# Patient Record
Sex: Female | Born: 1965 | Race: Black or African American | Hispanic: No
Health system: Southern US, Community
[De-identification: ages and names within clinical notes are randomized; demographics above are authoritative.]

## PROBLEM LIST (undated history)

## (undated) DIAGNOSIS — F329 Major depressive disorder, single episode, unspecified: Secondary | ICD-10-CM

## (undated) DIAGNOSIS — E213 Hyperparathyroidism, unspecified: Secondary | ICD-10-CM

## (undated) DIAGNOSIS — E559 Vitamin D deficiency, unspecified: Secondary | ICD-10-CM

## (undated) DIAGNOSIS — F419 Anxiety disorder, unspecified: Secondary | ICD-10-CM

## (undated) DIAGNOSIS — N186 End stage renal disease: Secondary | ICD-10-CM

## (undated) DIAGNOSIS — G2581 Restless legs syndrome: Secondary | ICD-10-CM

## (undated) DIAGNOSIS — F32A Depression, unspecified: Secondary | ICD-10-CM

## (undated) DIAGNOSIS — E611 Iron deficiency: Secondary | ICD-10-CM

## (undated) DIAGNOSIS — D649 Anemia, unspecified: Secondary | ICD-10-CM

## (undated) DIAGNOSIS — Z8614 Personal history of Methicillin resistant Staphylococcus aureus infection: Secondary | ICD-10-CM

## (undated) DIAGNOSIS — D890 Polyclonal hypergammaglobulinemia: Secondary | ICD-10-CM

## (undated) HISTORY — DX: Anxiety disorder, unspecified: F41.9

## (undated) HISTORY — DX: Depression, unspecified: F32.A

## (undated) HISTORY — DX: Iron deficiency: E61.1

## (undated) HISTORY — DX: Anemia, unspecified: D64.9

## (undated) HISTORY — DX: Personal history of Methicillin resistant Staphylococcus aureus infection: Z86.14

## (undated) HISTORY — DX: Restless legs syndrome: G25.81

## (undated) HISTORY — DX: Vitamin D deficiency, unspecified: E55.9

## (undated) HISTORY — DX: End stage renal disease: N18.6

## (undated) HISTORY — DX: Morbid (severe) obesity due to excess calories: E66.01

## (undated) HISTORY — DX: Polyclonal hypergammaglobulinemia: D89.0

## (undated) HISTORY — DX: Hyperparathyroidism, unspecified: E21.3

## (undated) HISTORY — DX: Major depressive disorder, single episode, unspecified: F32.9

---

## 2004-11-05 ENCOUNTER — Emergency Department: Payer: Self-pay | Admitting: General Practice

## 2010-03-29 ENCOUNTER — Ambulatory Visit: Payer: Self-pay | Admitting: Nephrology

## 2010-04-08 ENCOUNTER — Inpatient Hospital Stay: Payer: Self-pay | Admitting: Internal Medicine

## 2010-04-09 ENCOUNTER — Ambulatory Visit: Payer: Self-pay | Admitting: Oncology

## 2010-04-11 ENCOUNTER — Ambulatory Visit: Payer: Self-pay | Admitting: Oncology

## 2010-04-16 LAB — PATHOLOGY REPORT

## 2010-04-17 ENCOUNTER — Ambulatory Visit: Payer: Self-pay | Admitting: Vascular Surgery

## 2010-04-19 ENCOUNTER — Ambulatory Visit: Payer: Self-pay | Admitting: Vascular Surgery

## 2010-04-22 ENCOUNTER — Ambulatory Visit: Payer: Self-pay | Admitting: Vascular Surgery

## 2010-05-12 ENCOUNTER — Ambulatory Visit: Payer: Self-pay | Admitting: Oncology

## 2010-05-15 ENCOUNTER — Ambulatory Visit: Payer: Self-pay | Admitting: Vascular Surgery

## 2010-05-16 ENCOUNTER — Observation Stay: Payer: Self-pay | Admitting: Nephrology

## 2010-06-18 ENCOUNTER — Ambulatory Visit: Payer: Self-pay | Admitting: Internal Medicine

## 2010-09-11 ENCOUNTER — Ambulatory Visit: Payer: Self-pay | Admitting: Vascular Surgery

## 2011-06-23 ENCOUNTER — Inpatient Hospital Stay: Payer: Self-pay | Admitting: Surgery

## 2011-06-23 LAB — COMPREHENSIVE METABOLIC PANEL
Albumin: 2.4 g/dL — ABNORMAL LOW (ref 3.4–5.0)
Anion Gap: 11 (ref 7–16)
Calcium, Total: 9.5 mg/dL (ref 8.5–10.1)
Chloride: 94 mmol/L — ABNORMAL LOW (ref 98–107)
Co2: 30 mmol/L (ref 21–32)
EGFR (African American): 6 — ABNORMAL LOW
Osmolality: 281 (ref 275–301)
Potassium: 4 mmol/L (ref 3.5–5.1)
SGOT(AST): 8 U/L — ABNORMAL LOW (ref 15–37)
Sodium: 135 mmol/L — ABNORMAL LOW (ref 136–145)

## 2011-06-23 LAB — CBC WITH DIFFERENTIAL/PLATELET
Basophil #: 0 10*3/uL (ref 0.0–0.1)
Basophil %: 0.1 %
Eosinophil #: 0.1 10*3/uL (ref 0.0–0.7)
Eosinophil %: 0.5 %
HCT: 27.2 % — ABNORMAL LOW (ref 35.0–47.0)
HGB: 8.9 g/dL — ABNORMAL LOW (ref 12.0–16.0)
Lymphocyte %: 12.6 %
MCH: 29.8 pg (ref 26.0–34.0)
MCHC: 32.5 g/dL (ref 32.0–36.0)
Monocyte #: 1.4 10*3/uL — ABNORMAL HIGH (ref 0.0–0.7)
Neutrophil #: 14.8 10*3/uL — ABNORMAL HIGH (ref 1.4–6.5)
RBC: 2.97 10*6/uL — ABNORMAL LOW (ref 3.80–5.20)
RDW: 15 % — ABNORMAL HIGH (ref 11.5–14.5)

## 2011-06-24 LAB — BASIC METABOLIC PANEL
Anion Gap: 10 (ref 7–16)
Chloride: 95 mmol/L — ABNORMAL LOW (ref 98–107)
Co2: 31 mmol/L (ref 21–32)
Osmolality: 286 (ref 275–301)
Potassium: 4.1 mmol/L (ref 3.5–5.1)

## 2011-06-25 LAB — PHOSPHORUS: Phosphorus: 7.9 mg/dL — ABNORMAL HIGH (ref 2.5–4.9)

## 2011-06-26 LAB — CBC WITH DIFFERENTIAL/PLATELET
Basophil #: 0 10*3/uL (ref 0.0–0.1)
Eosinophil %: 1.8 %
HCT: 25 % — ABNORMAL LOW (ref 35.0–47.0)
HGB: 8.2 g/dL — ABNORMAL LOW (ref 12.0–16.0)
Lymphocyte #: 2.3 10*3/uL (ref 1.0–3.6)
Lymphocyte %: 20.5 %
MCH: 29.7 pg (ref 26.0–34.0)
Monocyte %: 11.5 %
Platelet: 423 10*3/uL (ref 150–440)
RDW: 13.8 % (ref 11.5–14.5)
WBC: 11.4 10*3/uL — ABNORMAL HIGH (ref 3.6–11.0)

## 2011-06-27 LAB — CBC WITH DIFFERENTIAL/PLATELET
Basophil #: 0 10*3/uL (ref 0.0–0.1)
Basophil %: 0.3 %
Eosinophil #: 0.2 10*3/uL (ref 0.0–0.7)
Eosinophil %: 2 %
HCT: 24.4 % — ABNORMAL LOW (ref 35.0–47.0)
HGB: 7.9 g/dL — ABNORMAL LOW (ref 12.0–16.0)
Lymphocyte #: 2 10*3/uL (ref 1.0–3.6)
Lymphocyte %: 23.4 %
MCH: 29.8 pg (ref 26.0–34.0)
MCV: 92 fL (ref 80–100)
Monocyte #: 0.9 10*3/uL — ABNORMAL HIGH (ref 0.0–0.7)
Monocyte %: 10.8 %
Neutrophil #: 5.5 10*3/uL (ref 1.4–6.5)
RDW: 14.7 % — ABNORMAL HIGH (ref 11.5–14.5)
WBC: 8.7 10*3/uL (ref 3.6–11.0)

## 2011-06-28 LAB — WOUND CULTURE

## 2011-06-29 LAB — BASIC METABOLIC PANEL
Anion Gap: 9 (ref 7–16)
BUN: 37 mg/dL — ABNORMAL HIGH (ref 7–18)
Calcium, Total: 9.6 mg/dL (ref 8.5–10.1)
Creatinine: 9.6 mg/dL — ABNORMAL HIGH (ref 0.60–1.30)
EGFR (Non-African Amer.): 5 — ABNORMAL LOW
Glucose: 84 mg/dL (ref 65–99)
Potassium: 4.2 mmol/L (ref 3.5–5.1)
Sodium: 136 mmol/L (ref 136–145)

## 2011-06-29 LAB — CBC WITH DIFFERENTIAL/PLATELET
Basophil #: 0 10*3/uL (ref 0.0–0.1)
Basophil %: 0.3 %
HCT: 25.6 % — ABNORMAL LOW (ref 35.0–47.0)
Lymphocyte %: 26.8 %
MCHC: 32 g/dL (ref 32.0–36.0)
Monocyte %: 9.4 %
Neutrophil %: 59.5 %
RDW: 14.7 % — ABNORMAL HIGH (ref 11.5–14.5)
WBC: 7.9 10*3/uL (ref 3.6–11.0)

## 2011-06-29 LAB — CULTURE, BLOOD (SINGLE)

## 2011-06-30 LAB — CBC WITH DIFFERENTIAL/PLATELET
Basophil #: 0 x10 3/mm 3
Basophil %: 0.5 %
Eosinophil #: 0.3 x10 3/mm 3
Eosinophil %: 3.7 %
HCT: 22.3 % — ABNORMAL LOW
HGB: 7.2 g/dL — ABNORMAL LOW
Lymphocyte %: 29.1 %
Lymphs Abs: 2.1 x10 3/mm 3
MCH: 29.5 pg
MCHC: 32.3 g/dL
MCV: 91 fL
Monocyte #: 0.7 x10 3/mm 3
Monocyte %: 10.2 %
Neutrophil #: 4 x10 3/mm 3
Neutrophil %: 56.5 %
Platelet: 420 x10 3/mm 3
RBC: 2.44 X10 6/mm 3 — ABNORMAL LOW
RDW: 14.4 %
WBC: 7.2 x10 3/mm 3

## 2011-06-30 LAB — PHOSPHORUS: Phosphorus: 6.7 mg/dL — ABNORMAL HIGH

## 2011-07-01 LAB — CBC WITH DIFFERENTIAL/PLATELET
Basophil #: 0 10*3/uL (ref 0.0–0.1)
Basophil %: 0.5 %
Eosinophil #: 0.2 10*3/uL (ref 0.0–0.7)
HCT: 27.6 % — ABNORMAL LOW (ref 35.0–47.0)
Lymphocyte #: 2.5 10*3/uL (ref 1.0–3.6)
MCHC: 32.9 g/dL (ref 32.0–36.0)
MCV: 91 fL (ref 80–100)
Monocyte #: 0.7 10*3/uL (ref 0.0–0.7)
Neutrophil #: 4.8 10*3/uL (ref 1.4–6.5)
Platelet: 381 10*3/uL (ref 150–440)
RBC: 3.04 10*6/uL — ABNORMAL LOW (ref 3.80–5.20)
RDW: 15 % — ABNORMAL HIGH (ref 11.5–14.5)

## 2011-07-02 LAB — CBC WITH DIFFERENTIAL/PLATELET
Basophil %: 0.5 %
Eosinophil #: 0.3 10*3/uL (ref 0.0–0.7)
Eosinophil %: 2.6 %
HCT: 25.6 % — ABNORMAL LOW (ref 35.0–47.0)
HGB: 8.7 g/dL — ABNORMAL LOW (ref 12.0–16.0)
Lymphocyte #: 3.6 10*3/uL (ref 1.0–3.6)
MCH: 30.7 pg (ref 26.0–34.0)
MCHC: 33.8 g/dL (ref 32.0–36.0)
MCV: 91 fL (ref 80–100)
Monocyte #: 0.8 10*3/uL — ABNORMAL HIGH (ref 0.0–0.7)
Neutrophil #: 5.1 10*3/uL (ref 1.4–6.5)
RBC: 2.82 10*6/uL — ABNORMAL LOW (ref 3.80–5.20)

## 2011-07-04 LAB — PHOSPHORUS: Phosphorus: 5.7 mg/dL — ABNORMAL HIGH (ref 2.5–4.9)

## 2011-07-24 ENCOUNTER — Inpatient Hospital Stay: Payer: Self-pay | Admitting: Specialist

## 2011-07-24 LAB — COMPREHENSIVE METABOLIC PANEL
Albumin: 3.1 g/dL — ABNORMAL LOW (ref 3.4–5.0)
Alkaline Phosphatase: 57 U/L (ref 50–136)
Bilirubin,Total: 0.4 mg/dL (ref 0.2–1.0)
Chloride: 98 mmol/L (ref 98–107)
Co2: 26 mmol/L (ref 21–32)
EGFR (Non-African Amer.): 3 — ABNORMAL LOW
Glucose: 78 mg/dL (ref 65–99)
Osmolality: 285 (ref 275–301)
Sodium: 138 mmol/L (ref 136–145)

## 2011-07-24 LAB — CBC
HCT: 21.1 % — ABNORMAL LOW (ref 35.0–47.0)
MCV: 91 fL (ref 80–100)
Platelet: 200 10*3/uL (ref 150–440)
RBC: 2.32 10*6/uL — ABNORMAL LOW (ref 3.80–5.20)
RDW: 15.3 % — ABNORMAL HIGH (ref 11.5–14.5)
WBC: 5.1 10*3/uL (ref 3.6–11.0)

## 2011-07-24 LAB — TROPONIN I: Troponin-I: 0.02 ng/mL

## 2011-07-25 LAB — COMPREHENSIVE METABOLIC PANEL
Albumin: 2.9 g/dL — ABNORMAL LOW (ref 3.4–5.0)
Alkaline Phosphatase: 48 U/L — ABNORMAL LOW (ref 50–136)
Anion Gap: 13 (ref 7–16)
BUN: 48 mg/dL — ABNORMAL HIGH (ref 7–18)
Bilirubin,Total: 0.4 mg/dL (ref 0.2–1.0)
Calcium, Total: 8.6 mg/dL (ref 8.5–10.1)
Chloride: 98 mmol/L (ref 98–107)
Creatinine: 13.27 mg/dL — ABNORMAL HIGH (ref 0.60–1.30)
Glucose: 86 mg/dL (ref 65–99)
Potassium: 5.2 mmol/L — ABNORMAL HIGH (ref 3.5–5.1)
SGOT(AST): 12 U/L — ABNORMAL LOW (ref 15–37)
Sodium: 139 mmol/L (ref 136–145)
Total Protein: 7.6 g/dL (ref 6.4–8.2)

## 2011-07-25 LAB — CBC WITH DIFFERENTIAL/PLATELET
Basophil #: 0 10*3/uL (ref 0.0–0.1)
Basophil %: 0.4 %
Eosinophil #: 0.2 10*3/uL (ref 0.0–0.7)
HCT: 23.6 % — ABNORMAL LOW (ref 35.0–47.0)
HGB: 7.7 g/dL — ABNORMAL LOW (ref 12.0–16.0)
Lymphocyte %: 43.4 %
MCH: 29.8 pg (ref 26.0–34.0)
MCHC: 32.5 g/dL (ref 32.0–36.0)
Monocyte %: 9.1 %
Neutrophil %: 43.6 %
RBC: 2.58 10*6/uL — ABNORMAL LOW (ref 3.80–5.20)
RDW: 14.9 % — ABNORMAL HIGH (ref 11.5–14.5)
WBC: 4.7 10*3/uL (ref 3.6–11.0)

## 2011-07-25 LAB — PROTIME-INR
INR: 0.9
Prothrombin Time: 12.8 secs (ref 11.5–14.7)

## 2011-07-25 LAB — URINALYSIS, COMPLETE
Blood: NEGATIVE
Glucose,UR: 150 mg/dL (ref 0–75)
Nitrite: NEGATIVE
Protein: 100
Squamous Epithelial: 30
WBC UR: 1 /HPF (ref 0–5)

## 2011-07-25 LAB — MAGNESIUM: Magnesium: 2 mg/dL

## 2011-07-25 LAB — HEMOGLOBIN: HGB: 8.7 g/dL — ABNORMAL LOW (ref 12.0–16.0)

## 2011-07-27 LAB — CBC WITH DIFFERENTIAL/PLATELET
Basophil %: 0.6 %
Eosinophil #: 0.1 10*3/uL (ref 0.0–0.7)
Eosinophil %: 2.3 %
HCT: 26.5 % — ABNORMAL LOW (ref 35.0–47.0)
HGB: 8.8 g/dL — ABNORMAL LOW (ref 12.0–16.0)
MCH: 30 pg (ref 26.0–34.0)
MCHC: 33.1 g/dL (ref 32.0–36.0)
MCV: 91 fL (ref 80–100)
Monocyte #: 0.5 10*3/uL (ref 0.0–0.7)
Monocyte %: 11 %
Neutrophil #: 2.2 10*3/uL (ref 1.4–6.5)
Neutrophil %: 45.1 %
Platelet: 202 10*3/uL (ref 150–440)
RBC: 2.93 10*6/uL — ABNORMAL LOW (ref 3.80–5.20)

## 2011-07-27 LAB — RETICULOCYTES
Absolute Retic Count: 0.06 10*6/uL (ref 0.024–0.084)
Reticulocyte: 2 % — ABNORMAL HIGH (ref 0.5–1.5)

## 2011-07-27 LAB — RENAL FUNCTION PANEL
Anion Gap: 9 (ref 7–16)
BUN: 18 mg/dL (ref 7–18)
Glucose: 87 mg/dL (ref 65–99)
Osmolality: 273 (ref 275–301)
Phosphorus: 4.6 mg/dL (ref 2.5–4.9)
Potassium: 4 mmol/L (ref 3.5–5.1)
Sodium: 136 mmol/L (ref 136–145)

## 2011-08-04 ENCOUNTER — Inpatient Hospital Stay: Payer: Self-pay | Admitting: Internal Medicine

## 2011-08-04 LAB — CBC
HGB: 7.9 g/dL — ABNORMAL LOW (ref 12.0–16.0)
MCH: 29.6 pg (ref 26.0–34.0)
MCHC: 32.9 g/dL (ref 32.0–36.0)
MCV: 90 fL (ref 80–100)
Platelet: 170 10*3/uL (ref 150–440)
WBC: 5.9 10*3/uL (ref 3.6–11.0)

## 2011-08-04 LAB — BASIC METABOLIC PANEL
BUN: 74 mg/dL — ABNORMAL HIGH (ref 7–18)
Chloride: 100 mmol/L (ref 98–107)
Creatinine: 19.57 mg/dL — ABNORMAL HIGH (ref 0.60–1.30)
EGFR (African American): 2 — ABNORMAL LOW
EGFR (Non-African Amer.): 2 — ABNORMAL LOW
Osmolality: 298 (ref 275–301)
Potassium: 6.7 mmol/L (ref 3.5–5.1)

## 2011-08-04 LAB — APTT: Activated PTT: 36.9 secs — ABNORMAL HIGH (ref 23.6–35.9)

## 2011-08-04 LAB — PROTIME-INR: Prothrombin Time: 13.4 secs (ref 11.5–14.7)

## 2011-08-05 LAB — BASIC METABOLIC PANEL
BUN: 74 mg/dL — ABNORMAL HIGH (ref 7–18)
Chloride: 98 mmol/L (ref 98–107)
Co2: 27 mmol/L (ref 21–32)
Creatinine: 19.76 mg/dL — ABNORMAL HIGH (ref 0.60–1.30)
EGFR (African American): 2 — ABNORMAL LOW
EGFR (Non-African Amer.): 2 — ABNORMAL LOW
Glucose: 112 mg/dL — ABNORMAL HIGH (ref 65–99)
Osmolality: 298 (ref 275–301)
Potassium: 6.8 mmol/L (ref 3.5–5.1)
Sodium: 138 mmol/L (ref 136–145)

## 2011-08-05 LAB — PHOSPHORUS: Phosphorus: 7.9 mg/dL — ABNORMAL HIGH (ref 2.5–4.9)

## 2011-08-05 LAB — POTASSIUM: Potassium: 5.3 mmol/L — ABNORMAL HIGH (ref 3.5–5.1)

## 2011-08-06 LAB — IRON AND TIBC
Iron Bind.Cap.(Total): 181 ug/dL — ABNORMAL LOW (ref 250–450)
Iron Saturation: 15 %
Iron: 28 ug/dL — ABNORMAL LOW (ref 50–170)
Unbound Iron-Bind.Cap.: 153 ug/dL

## 2011-08-06 LAB — BASIC METABOLIC PANEL
BUN: 32 mg/dL — ABNORMAL HIGH (ref 7–18)
Chloride: 98 mmol/L (ref 98–107)
Co2: 32 mmol/L (ref 21–32)
Creatinine: 11.37 mg/dL — ABNORMAL HIGH (ref 0.60–1.30)
EGFR (Non-African Amer.): 4 — ABNORMAL LOW
Potassium: 4.3 mmol/L (ref 3.5–5.1)

## 2011-08-06 LAB — CBC WITH DIFFERENTIAL/PLATELET
Basophil #: 0 10*3/uL (ref 0.0–0.1)
Basophil %: 0.4 %
Eosinophil #: 0.3 10*3/uL (ref 0.0–0.7)
Eosinophil %: 4.6 %
HCT: 23 % — ABNORMAL LOW (ref 35.0–47.0)
HGB: 7.5 g/dL — ABNORMAL LOW (ref 12.0–16.0)
Lymphocyte #: 2.5 10*3/uL (ref 1.0–3.6)
MCV: 91 fL (ref 80–100)
Neutrophil #: 2.5 10*3/uL (ref 1.4–6.5)
Platelet: 147 10*3/uL — ABNORMAL LOW (ref 150–440)
RBC: 2.52 10*6/uL — ABNORMAL LOW (ref 3.80–5.20)
RDW: 14.8 % — ABNORMAL HIGH (ref 11.5–14.5)
WBC: 5.9 10*3/uL (ref 3.6–11.0)

## 2011-08-06 LAB — TROPONIN I: Troponin-I: 0.05 ng/mL

## 2011-08-06 LAB — PHOSPHORUS: Phosphorus: 6.9 mg/dL — ABNORMAL HIGH (ref 2.5–4.9)

## 2013-07-19 ENCOUNTER — Inpatient Hospital Stay: Payer: Self-pay | Admitting: Internal Medicine

## 2013-07-19 ENCOUNTER — Encounter: Payer: Self-pay | Admitting: *Deleted

## 2013-07-19 LAB — TROPONIN I
TROPONIN-I: 0.14 ng/mL — AB
TROPONIN-I: 0.17 ng/mL — AB
Troponin-I: 0.12 ng/mL — ABNORMAL HIGH

## 2013-07-19 LAB — CK TOTAL AND CKMB (NOT AT ARMC)
CK, Total: 53 U/L
CK, Total: 53 U/L
CK-MB: 1.9 ng/mL (ref 0.5–3.6)
CK-MB: 1.8 ng/mL (ref 0.5–3.6)

## 2013-07-19 LAB — COMPREHENSIVE METABOLIC PANEL
AST: 24 U/L (ref 15–37)
Albumin: 2.9 g/dL — ABNORMAL LOW (ref 3.4–5.0)
Alkaline Phosphatase: 75 U/L
Anion Gap: 8 (ref 7–16)
BILIRUBIN TOTAL: 0.8 mg/dL (ref 0.2–1.0)
BUN: 34 mg/dL — AB (ref 7–18)
Calcium, Total: 9.3 mg/dL (ref 8.5–10.1)
Chloride: 100 mmol/L (ref 98–107)
Co2: 29 mmol/L (ref 21–32)
Creatinine: 5.64 mg/dL — ABNORMAL HIGH (ref 0.60–1.30)
EGFR (Non-African Amer.): 8 — ABNORMAL LOW
GFR CALC AF AMER: 10 — AB
Glucose: 78 mg/dL (ref 65–99)
Osmolality: 280 (ref 275–301)
Potassium: 4 mmol/L (ref 3.5–5.1)
SGPT (ALT): 14 U/L (ref 12–78)
Sodium: 137 mmol/L (ref 136–145)
Total Protein: 8.5 g/dL — ABNORMAL HIGH (ref 6.4–8.2)

## 2013-07-19 LAB — CBC
HCT: 36 % (ref 35.0–47.0)
HGB: 11.9 g/dL — ABNORMAL LOW (ref 12.0–16.0)
MCH: 29.9 pg (ref 26.0–34.0)
MCHC: 33.2 g/dL (ref 32.0–36.0)
MCV: 90 fL (ref 80–100)
Platelet: 156 10*3/uL (ref 150–440)
RBC: 3.99 10*6/uL (ref 3.80–5.20)
RDW: 16.4 % — ABNORMAL HIGH (ref 11.5–14.5)
WBC: 5.5 10*3/uL (ref 3.6–11.0)

## 2013-07-19 LAB — TSH: Thyroid Stimulating Horm: 0.78 u[IU]/mL

## 2013-07-20 ENCOUNTER — Ambulatory Visit: Payer: Self-pay | Admitting: Cardiology

## 2013-07-20 LAB — LIPID PANEL
Cholesterol: 92 mg/dL (ref 0–200)
HDL: 42 mg/dL (ref 40–60)
Ldl Cholesterol, Calc: 38 mg/dL (ref 0–100)
TRIGLYCERIDES: 59 mg/dL (ref 0–200)
VLDL Cholesterol, Calc: 12 mg/dL (ref 5–40)

## 2013-07-20 LAB — CBC WITH DIFFERENTIAL/PLATELET
BASOS ABS: 0 10*3/uL (ref 0.0–0.1)
BASOS PCT: 0.7 %
Eosinophil #: 0.3 10*3/uL (ref 0.0–0.7)
Eosinophil %: 7.4 %
HCT: 34.5 % — ABNORMAL LOW (ref 35.0–47.0)
HGB: 11.1 g/dL — ABNORMAL LOW (ref 12.0–16.0)
Lymphocyte #: 1.2 10*3/uL (ref 1.0–3.6)
Lymphocyte %: 27.1 %
MCH: 29.4 pg (ref 26.0–34.0)
MCHC: 32.3 g/dL (ref 32.0–36.0)
MCV: 91 fL (ref 80–100)
MONO ABS: 0.4 x10 3/mm (ref 0.2–0.9)
Monocyte %: 10 %
NEUTROS ABS: 2.4 10*3/uL (ref 1.4–6.5)
Neutrophil %: 54.8 %
Platelet: 140 10*3/uL — ABNORMAL LOW (ref 150–440)
RBC: 3.78 10*6/uL — ABNORMAL LOW (ref 3.80–5.20)
RDW: 16.7 % — ABNORMAL HIGH (ref 11.5–14.5)
WBC: 4.3 10*3/uL (ref 3.6–11.0)

## 2013-07-20 LAB — BASIC METABOLIC PANEL
Anion Gap: 5 — ABNORMAL LOW (ref 7–16)
BUN: 49 mg/dL — ABNORMAL HIGH (ref 7–18)
CHLORIDE: 100 mmol/L (ref 98–107)
Calcium, Total: 8.9 mg/dL (ref 8.5–10.1)
Co2: 31 mmol/L (ref 21–32)
Creatinine: 6.98 mg/dL — ABNORMAL HIGH (ref 0.60–1.30)
EGFR (African American): 7 — ABNORMAL LOW
GFR CALC NON AF AMER: 6 — AB
Glucose: 99 mg/dL (ref 65–99)
Osmolality: 285 (ref 275–301)
POTASSIUM: 5.3 mmol/L — AB (ref 3.5–5.1)
Sodium: 136 mmol/L (ref 136–145)

## 2013-07-20 LAB — HEMOGLOBIN A1C: HEMOGLOBIN A1C: 4.6 % (ref 4.2–6.3)

## 2013-07-20 LAB — PHOSPHORUS: Phosphorus: 8.2 mg/dL — ABNORMAL HIGH (ref 2.5–4.9)

## 2013-07-21 LAB — PHOSPHORUS: Phosphorus: 5.9 mg/dL — ABNORMAL HIGH (ref 2.5–4.9)

## 2013-07-21 LAB — POTASSIUM: Potassium: 4.6 mmol/L (ref 3.5–5.1)

## 2013-07-21 LAB — KAPPA/LAMBDA FREE LIGHT CHAINS (ARMC)

## 2013-07-21 LAB — PROTEIN ELECTROPHORESIS(ARMC)

## 2013-08-03 ENCOUNTER — Emergency Department: Payer: Self-pay | Admitting: Emergency Medicine

## 2013-09-15 ENCOUNTER — Inpatient Hospital Stay: Payer: Self-pay | Admitting: Internal Medicine

## 2013-09-15 LAB — COMPREHENSIVE METABOLIC PANEL
ALT: 7 U/L — AB (ref 12–78)
Albumin: 2.1 g/dL — ABNORMAL LOW (ref 3.4–5.0)
Alkaline Phosphatase: 74 U/L
Anion Gap: 9 (ref 7–16)
BUN: 47 mg/dL — ABNORMAL HIGH (ref 7–18)
Bilirubin,Total: 0.6 mg/dL (ref 0.2–1.0)
CALCIUM: 9.4 mg/dL (ref 8.5–10.1)
CO2: 28 mmol/L (ref 21–32)
Chloride: 99 mmol/L (ref 98–107)
Creatinine: 7.85 mg/dL — ABNORMAL HIGH (ref 0.60–1.30)
EGFR (African American): 6 — ABNORMAL LOW
GFR CALC NON AF AMER: 5 — AB
Glucose: 84 mg/dL (ref 65–99)
Osmolality: 283 (ref 275–301)
Potassium: 3.4 mmol/L — ABNORMAL LOW (ref 3.5–5.1)
SGOT(AST): 26 U/L (ref 15–37)
Sodium: 136 mmol/L (ref 136–145)
Total Protein: 7.7 g/dL (ref 6.4–8.2)

## 2013-09-15 LAB — CBC
HCT: 31 % — AB (ref 35.0–47.0)
HGB: 10 g/dL — AB (ref 12.0–16.0)
MCH: 27.2 pg (ref 26.0–34.0)
MCHC: 32.4 g/dL (ref 32.0–36.0)
MCV: 84 fL (ref 80–100)
PLATELETS: 256 10*3/uL (ref 150–440)
RBC: 3.69 10*6/uL — ABNORMAL LOW (ref 3.80–5.20)
RDW: 17.1 % — AB (ref 11.5–14.5)
WBC: 7.9 10*3/uL (ref 3.6–11.0)

## 2013-09-15 LAB — PHOSPHORUS: Phosphorus: 4.2 mg/dL (ref 2.5–4.9)

## 2013-09-15 LAB — TROPONIN I
Troponin-I: 0.7 ng/mL — ABNORMAL HIGH
Troponin-I: 0.57 ng/mL — ABNORMAL HIGH
Troponin-I: 0.54 ng/mL — ABNORMAL HIGH

## 2013-09-15 LAB — CK-MB
CK-MB: 1.4 ng/mL (ref 0.5–3.6)
CK-MB: 1.7 ng/mL (ref 0.5–3.6)
CK-MB: 1.5 ng/mL (ref 0.5–3.6)

## 2013-09-15 LAB — APTT: Activated PTT: 45.1 secs — ABNORMAL HIGH (ref 23.6–35.9)

## 2013-09-15 LAB — LIPASE, BLOOD: Lipase: 100 U/L (ref 73–393)

## 2013-09-16 LAB — CBC WITH DIFFERENTIAL/PLATELET
Bands: 22 %
HCT: 27 % — ABNORMAL LOW (ref 35.0–47.0)
HGB: 8.6 g/dL — AB (ref 12.0–16.0)
Lymphocytes: 26 %
MCH: 27 pg (ref 26.0–34.0)
MCHC: 31.8 g/dL — ABNORMAL LOW (ref 32.0–36.0)
MCV: 85 fL (ref 80–100)
METAMYELOCYTE: 6 %
MYELOCYTE: 1 %
Monocytes: 9 %
PLATELETS: 190 10*3/uL (ref 150–440)
RBC: 3.19 10*6/uL — AB (ref 3.80–5.20)
RDW: 17.2 % — AB (ref 11.5–14.5)
Segmented Neutrophils: 36 %
WBC: 3.2 10*3/uL — ABNORMAL LOW (ref 3.6–11.0)

## 2013-09-16 LAB — BASIC METABOLIC PANEL
Anion Gap: 3 — ABNORMAL LOW (ref 7–16)
BUN: 20 mg/dL — AB (ref 7–18)
CALCIUM: 8.1 mg/dL — AB (ref 8.5–10.1)
CHLORIDE: 99 mmol/L (ref 98–107)
CREATININE: 4.14 mg/dL — AB (ref 0.60–1.30)
Co2: 34 mmol/L — ABNORMAL HIGH (ref 21–32)
GFR CALC AF AMER: 14 — AB
GFR CALC NON AF AMER: 12 — AB
Glucose: 90 mg/dL (ref 65–99)
Osmolality: 274 (ref 275–301)
POTASSIUM: 3.1 mmol/L — AB (ref 3.5–5.1)
Sodium: 136 mmol/L (ref 136–145)

## 2013-09-16 LAB — PHOSPHORUS: PHOSPHORUS: 3.3 mg/dL (ref 2.5–4.9)

## 2013-09-16 LAB — APTT
ACTIVATED PTT: 91 s — AB (ref 23.6–35.9)
Activated PTT: 86.2 secs — ABNORMAL HIGH (ref 23.6–35.9)

## 2013-09-17 LAB — BASIC METABOLIC PANEL
ANION GAP: 7 (ref 7–16)
BUN: 24 mg/dL — ABNORMAL HIGH (ref 7–18)
CREATININE: 3.62 mg/dL — AB (ref 0.60–1.30)
Calcium, Total: 8.4 mg/dL — ABNORMAL LOW (ref 8.5–10.1)
Chloride: 100 mmol/L (ref 98–107)
Co2: 32 mmol/L (ref 21–32)
GFR CALC AF AMER: 16 — AB
GFR CALC NON AF AMER: 14 — AB
Glucose: 94 mg/dL (ref 65–99)
OSMOLALITY: 281 (ref 275–301)
Potassium: 3.3 mmol/L — ABNORMAL LOW (ref 3.5–5.1)
Sodium: 139 mmol/L (ref 136–145)

## 2013-09-17 LAB — CBC WITH DIFFERENTIAL/PLATELET
BANDS NEUTROPHIL: 7 %
HCT: 30.1 % — ABNORMAL LOW (ref 35.0–47.0)
HGB: 9.7 g/dL — ABNORMAL LOW (ref 12.0–16.0)
LYMPHS PCT: 5 %
MCH: 27.1 pg (ref 26.0–34.0)
MCHC: 32.2 g/dL (ref 32.0–36.0)
MCV: 84 fL (ref 80–100)
METAMYELOCYTE: 1 %
MONOS PCT: 4 %
Myelocyte: 1 %
Platelet: 198 10*3/uL (ref 150–440)
RBC: 3.58 10*6/uL — AB (ref 3.80–5.20)
RDW: 17.5 % — AB (ref 11.5–14.5)
SEGMENTED NEUTROPHILS: 82 %
WBC: 3.5 10*3/uL — ABNORMAL LOW (ref 3.6–11.0)

## 2013-09-17 LAB — APTT: Activated PTT: 43 secs — ABNORMAL HIGH (ref 23.6–35.9)

## 2013-09-17 LAB — PHOSPHORUS: PHOSPHORUS: 3.1 mg/dL (ref 2.5–4.9)

## 2013-09-19 LAB — CBC WITH DIFFERENTIAL/PLATELET
BASOS ABS: 0 10*3/uL (ref 0.0–0.1)
Basophil %: 0.3 %
EOS ABS: 0 10*3/uL (ref 0.0–0.7)
EOS PCT: 0.3 %
HCT: 29.1 % — ABNORMAL LOW (ref 35.0–47.0)
HGB: 9.2 g/dL — AB (ref 12.0–16.0)
LYMPHS ABS: 0.3 10*3/uL — AB (ref 1.0–3.6)
Lymphocyte %: 9.3 %
MCH: 26.6 pg (ref 26.0–34.0)
MCHC: 31.5 g/dL — ABNORMAL LOW (ref 32.0–36.0)
MCV: 85 fL (ref 80–100)
MONOS PCT: 7.6 %
Monocyte #: 0.2 x10 3/mm (ref 0.2–0.9)
NEUTROS ABS: 2.7 10*3/uL (ref 1.4–6.5)
NEUTROS PCT: 82.5 %
Platelet: 188 10*3/uL (ref 150–440)
RBC: 3.45 10*6/uL — ABNORMAL LOW (ref 3.80–5.20)
RDW: 17.7 % — ABNORMAL HIGH (ref 11.5–14.5)
WBC: 3.3 10*3/uL — ABNORMAL LOW (ref 3.6–11.0)

## 2013-09-20 LAB — CULTURE, BLOOD (SINGLE)

## 2013-09-20 LAB — PHOSPHORUS: PHOSPHORUS: 2.7 mg/dL (ref 2.5–4.9)

## 2013-09-22 LAB — PHOSPHORUS: Phosphorus: 2.5 mg/dL (ref 2.5–4.9)

## 2013-09-22 LAB — HEMOGLOBIN A1C: HEMOGLOBIN A1C: 5.4 % (ref 4.2–6.3)

## 2013-09-23 LAB — TROPONIN I
Troponin-I: 0.06 ng/mL — ABNORMAL HIGH
Troponin-I: 0.07 ng/mL — ABNORMAL HIGH

## 2013-09-23 LAB — EXPECTORATED SPUTUM ASSESSMENT W GRAM STAIN, RFLX TO RESP C

## 2013-09-23 LAB — PLATELET COUNT: PLATELETS: 154 10*3/uL (ref 150–440)

## 2013-09-24 LAB — CBC WITH DIFFERENTIAL/PLATELET
BASOS ABS: 0 10*3/uL (ref 0.0–0.1)
Basophil %: 0.9 %
Eosinophil #: 0.1 10*3/uL (ref 0.0–0.7)
Eosinophil %: 1.3 %
HCT: 31.1 % — AB (ref 35.0–47.0)
HGB: 9.8 g/dL — AB (ref 12.0–16.0)
Lymphocyte #: 0.8 10*3/uL — ABNORMAL LOW (ref 1.0–3.6)
Lymphocyte %: 15.4 %
MCH: 26.5 pg (ref 26.0–34.0)
MCHC: 31.6 g/dL — ABNORMAL LOW (ref 32.0–36.0)
MCV: 84 fL (ref 80–100)
MONOS PCT: 8.5 %
Monocyte #: 0.4 x10 3/mm (ref 0.2–0.9)
NEUTROS ABS: 3.9 10*3/uL (ref 1.4–6.5)
Neutrophil %: 73.9 %
Platelet: 188 10*3/uL (ref 150–440)
RBC: 3.71 10*6/uL — ABNORMAL LOW (ref 3.80–5.20)
RDW: 18.5 % — AB (ref 11.5–14.5)
WBC: 5.2 10*3/uL (ref 3.6–11.0)

## 2013-09-24 LAB — RENAL FUNCTION PANEL
Albumin: 1.8 g/dL — ABNORMAL LOW (ref 3.4–5.0)
Anion Gap: 6 — ABNORMAL LOW (ref 7–16)
BUN: 19 mg/dL — ABNORMAL HIGH (ref 7–18)
CHLORIDE: 102 mmol/L (ref 98–107)
CO2: 32 mmol/L (ref 21–32)
CREATININE: 5.22 mg/dL — AB (ref 0.60–1.30)
Calcium, Total: 7.5 mg/dL — ABNORMAL LOW (ref 8.5–10.1)
EGFR (Non-African Amer.): 9 — ABNORMAL LOW
GFR CALC AF AMER: 10 — AB
Glucose: 82 mg/dL (ref 65–99)
Osmolality: 281 (ref 275–301)
POTASSIUM: 3.1 mmol/L — AB (ref 3.5–5.1)
Phosphorus: 1.7 mg/dL — ABNORMAL LOW (ref 2.5–4.9)
SODIUM: 140 mmol/L (ref 136–145)

## 2013-09-24 LAB — PHOSPHORUS: Phosphorus: 1.9 mg/dL — ABNORMAL LOW (ref 2.5–4.9)

## 2013-09-24 LAB — GLUCOSE, RANDOM
GLUCOSE: 62 mg/dL — AB (ref 65–99)
Glucose: 51 mg/dL — ABNORMAL LOW (ref 65–99)

## 2013-09-26 ENCOUNTER — Encounter: Payer: Self-pay | Admitting: Internal Medicine

## 2013-10-04 ENCOUNTER — Emergency Department: Payer: Self-pay | Admitting: Emergency Medicine

## 2013-10-04 LAB — COMPREHENSIVE METABOLIC PANEL
ALBUMIN: 2.3 g/dL — AB (ref 3.4–5.0)
ALT: 9 U/L — AB (ref 12–78)
AST: 30 U/L (ref 15–37)
Alkaline Phosphatase: 135 U/L — ABNORMAL HIGH
Anion Gap: 11 (ref 7–16)
BILIRUBIN TOTAL: 0.4 mg/dL (ref 0.2–1.0)
BUN: 37 mg/dL — ABNORMAL HIGH (ref 7–18)
CHLORIDE: 104 mmol/L (ref 98–107)
Calcium, Total: 7.7 mg/dL — ABNORMAL LOW (ref 8.5–10.1)
Co2: 24 mmol/L (ref 21–32)
Creatinine: 5.94 mg/dL — ABNORMAL HIGH (ref 0.60–1.30)
EGFR (African American): 9 — ABNORMAL LOW
EGFR (Non-African Amer.): 8 — ABNORMAL LOW
GLUCOSE: 84 mg/dL (ref 65–99)
Osmolality: 285 (ref 275–301)
Potassium: 3 mmol/L — ABNORMAL LOW (ref 3.5–5.1)
Sodium: 139 mmol/L (ref 136–145)
TOTAL PROTEIN: 7.3 g/dL (ref 6.4–8.2)

## 2013-10-04 LAB — LIPASE, BLOOD: LIPASE: 261 U/L (ref 73–393)

## 2013-10-04 LAB — CBC
HCT: 33 % — ABNORMAL LOW (ref 35.0–47.0)
HGB: 10.6 g/dL — ABNORMAL LOW (ref 12.0–16.0)
MCH: 27.2 pg (ref 26.0–34.0)
MCHC: 32.1 g/dL (ref 32.0–36.0)
MCV: 85 fL (ref 80–100)
Platelet: 199 10*3/uL (ref 150–440)
RBC: 3.91 10*6/uL (ref 3.80–5.20)
RDW: 21.7 % — ABNORMAL HIGH (ref 11.5–14.5)
WBC: 4.5 10*3/uL (ref 3.6–11.0)

## 2013-10-10 ENCOUNTER — Encounter: Payer: Self-pay | Admitting: Internal Medicine

## 2013-10-10 ENCOUNTER — Ambulatory Visit: Payer: Self-pay | Admitting: Internal Medicine

## 2013-10-15 ENCOUNTER — Inpatient Hospital Stay: Payer: Self-pay | Admitting: Internal Medicine

## 2013-10-15 LAB — COMPREHENSIVE METABOLIC PANEL
ALBUMIN: 1.9 g/dL — AB (ref 3.4–5.0)
ALK PHOS: 98 U/L
ANION GAP: 14 (ref 7–16)
BUN: 72 mg/dL — ABNORMAL HIGH (ref 7–18)
Bilirubin,Total: 0.6 mg/dL (ref 0.2–1.0)
CALCIUM: 7.9 mg/dL — AB (ref 8.5–10.1)
CHLORIDE: 101 mmol/L (ref 98–107)
Co2: 22 mmol/L (ref 21–32)
Creatinine: 9.4 mg/dL — ABNORMAL HIGH (ref 0.60–1.30)
EGFR (African American): 5 — ABNORMAL LOW
GFR CALC NON AF AMER: 4 — AB
Glucose: 69 mg/dL (ref 65–99)
OSMOLALITY: 293 (ref 275–301)
POTASSIUM: 3.7 mmol/L (ref 3.5–5.1)
SGOT(AST): 31 U/L (ref 15–37)
SGPT (ALT): 8 U/L — ABNORMAL LOW (ref 12–78)
Sodium: 137 mmol/L (ref 136–145)
Total Protein: 6.8 g/dL (ref 6.4–8.2)

## 2013-10-15 LAB — LIPASE, BLOOD: LIPASE: 122 U/L (ref 73–393)

## 2013-10-15 LAB — CBC
HCT: 33.1 % — AB (ref 35.0–47.0)
HGB: 10.5 g/dL — AB (ref 12.0–16.0)
MCH: 26.6 pg (ref 26.0–34.0)
MCHC: 31.8 g/dL — AB (ref 32.0–36.0)
MCV: 84 fL (ref 80–100)
Platelet: 131 10*3/uL — ABNORMAL LOW (ref 150–440)
RBC: 3.95 10*6/uL (ref 3.80–5.20)
RDW: 20.9 % — ABNORMAL HIGH (ref 11.5–14.5)
WBC: 8.5 10*3/uL (ref 3.6–11.0)

## 2013-10-15 LAB — PHOSPHORUS: Phosphorus: 4.8 mg/dL (ref 2.5–4.9)

## 2013-10-15 LAB — MAGNESIUM: MAGNESIUM: 1.4 mg/dL — AB

## 2013-10-16 LAB — CBC WITH DIFFERENTIAL/PLATELET
Basophil #: 0 10*3/uL (ref 0.0–0.1)
Basophil %: 0.2 %
Eosinophil #: 0 10*3/uL (ref 0.0–0.7)
Eosinophil %: 0.3 %
HCT: 30.1 % — ABNORMAL LOW (ref 35.0–47.0)
HGB: 9.5 g/dL — ABNORMAL LOW (ref 12.0–16.0)
LYMPHS PCT: 5 %
Lymphocyte #: 0.3 10*3/uL — ABNORMAL LOW (ref 1.0–3.6)
MCH: 26.8 pg (ref 26.0–34.0)
MCHC: 31.6 g/dL — AB (ref 32.0–36.0)
MCV: 85 fL (ref 80–100)
Monocyte #: 0.5 x10 3/mm (ref 0.2–0.9)
Monocyte %: 7.3 %
NEUTROS PCT: 87.2 %
Neutrophil #: 6.1 10*3/uL (ref 1.4–6.5)
Platelet: 125 10*3/uL — ABNORMAL LOW (ref 150–440)
RBC: 3.54 10*6/uL — ABNORMAL LOW (ref 3.80–5.20)
RDW: 20.5 % — ABNORMAL HIGH (ref 11.5–14.5)
WBC: 7 10*3/uL (ref 3.6–11.0)

## 2013-10-16 LAB — BASIC METABOLIC PANEL
ANION GAP: 6 — AB (ref 7–16)
BUN: 32 mg/dL — ABNORMAL HIGH (ref 7–18)
CALCIUM: 7.5 mg/dL — AB (ref 8.5–10.1)
Chloride: 102 mmol/L (ref 98–107)
Co2: 31 mmol/L (ref 21–32)
Creatinine: 5.45 mg/dL — ABNORMAL HIGH (ref 0.60–1.30)
EGFR (African American): 10 — ABNORMAL LOW
EGFR (Non-African Amer.): 9 — ABNORMAL LOW
GLUCOSE: 56 mg/dL — AB (ref 65–99)
Osmolality: 282 (ref 275–301)
POTASSIUM: 3.4 mmol/L — AB (ref 3.5–5.1)
Sodium: 139 mmol/L (ref 136–145)

## 2013-10-17 LAB — CBC WITH DIFFERENTIAL/PLATELET
Basophil #: 0 10*3/uL (ref 0.0–0.1)
Basophil %: 0.6 %
Eosinophil #: 0 10*3/uL (ref 0.0–0.7)
Eosinophil %: 0.5 %
HCT: 29.9 % — ABNORMAL LOW (ref 35.0–47.0)
HGB: 9.2 g/dL — ABNORMAL LOW (ref 12.0–16.0)
LYMPHS ABS: 0.8 10*3/uL — AB (ref 1.0–3.6)
Lymphocyte %: 8.9 %
MCH: 26.4 pg (ref 26.0–34.0)
MCHC: 30.7 g/dL — ABNORMAL LOW (ref 32.0–36.0)
MCV: 86 fL (ref 80–100)
Monocyte #: 0.5 x10 3/mm (ref 0.2–0.9)
Monocyte %: 5.6 %
NEUTROS PCT: 84.4 %
Neutrophil #: 7.2 10*3/uL — ABNORMAL HIGH (ref 1.4–6.5)
PLATELETS: 126 10*3/uL — AB (ref 150–440)
RBC: 3.47 10*6/uL — ABNORMAL LOW (ref 3.80–5.20)
RDW: 20.2 % — ABNORMAL HIGH (ref 11.5–14.5)
WBC: 8.5 10*3/uL (ref 3.6–11.0)

## 2013-10-17 LAB — VANCOMYCIN, TROUGH: Vancomycin, Trough: 12 ug/mL (ref 10–20)

## 2013-10-18 LAB — CBC WITH DIFFERENTIAL/PLATELET
Basophil #: 0 10*3/uL (ref 0.0–0.1)
Basophil %: 0.2 %
EOS ABS: 0 10*3/uL (ref 0.0–0.7)
Eosinophil %: 0.1 %
HCT: 28.6 % — AB (ref 35.0–47.0)
HGB: 8.9 g/dL — ABNORMAL LOW (ref 12.0–16.0)
Lymphocyte #: 0.3 10*3/uL — ABNORMAL LOW (ref 1.0–3.6)
Lymphocyte %: 3.6 %
MCH: 26.2 pg (ref 26.0–34.0)
MCHC: 31 g/dL — AB (ref 32.0–36.0)
MCV: 85 fL (ref 80–100)
MONO ABS: 0.6 x10 3/mm (ref 0.2–0.9)
Monocyte %: 6.2 %
NEUTROS ABS: 8.1 10*3/uL — AB (ref 1.4–6.5)
Neutrophil %: 89.9 %
Platelet: 121 10*3/uL — ABNORMAL LOW (ref 150–440)
RBC: 3.38 10*6/uL — AB (ref 3.80–5.20)
RDW: 20.4 % — AB (ref 11.5–14.5)
WBC: 9 10*3/uL (ref 3.6–11.0)

## 2013-10-18 LAB — COMPREHENSIVE METABOLIC PANEL
ALK PHOS: 75 U/L
ANION GAP: 12 (ref 7–16)
Albumin: 1.3 g/dL — ABNORMAL LOW (ref 3.4–5.0)
BUN: 57 mg/dL — ABNORMAL HIGH (ref 7–18)
Bilirubin,Total: 0.5 mg/dL (ref 0.2–1.0)
CHLORIDE: 100 mmol/L (ref 98–107)
CO2: 24 mmol/L (ref 21–32)
CREATININE: 7.76 mg/dL — AB (ref 0.60–1.30)
Calcium, Total: 6.5 mg/dL — CL (ref 8.5–10.1)
EGFR (African American): 6 — ABNORMAL LOW
EGFR (Non-African Amer.): 6 — ABNORMAL LOW
Glucose: 70 mg/dL (ref 65–99)
Osmolality: 286 (ref 275–301)
Potassium: 3.7 mmol/L (ref 3.5–5.1)
SGOT(AST): 48 U/L — ABNORMAL HIGH (ref 15–37)
SGPT (ALT): 10 U/L — ABNORMAL LOW (ref 12–78)
Sodium: 136 mmol/L (ref 136–145)
TOTAL PROTEIN: 5.4 g/dL — AB (ref 6.4–8.2)

## 2013-10-18 LAB — CLOSTRIDIUM DIFFICILE(ARMC)

## 2013-10-19 LAB — WBCS, STOOL

## 2013-10-19 LAB — STOOL CULTURE

## 2013-10-20 LAB — CBC WITH DIFFERENTIAL/PLATELET
BASOS ABS: 0 10*3/uL (ref 0.0–0.1)
Basophil %: 0 %
EOS ABS: 0 10*3/uL (ref 0.0–0.7)
Eosinophil %: 0.1 %
HCT: 31.4 % — ABNORMAL LOW (ref 35.0–47.0)
HGB: 9.8 g/dL — AB (ref 12.0–16.0)
LYMPHS ABS: 0.2 10*3/uL — AB (ref 1.0–3.6)
Lymphocyte %: 4 %
MCH: 26.4 pg (ref 26.0–34.0)
MCHC: 31.3 g/dL — AB (ref 32.0–36.0)
MCV: 84 fL (ref 80–100)
Monocyte #: 0.4 x10 3/mm (ref 0.2–0.9)
Monocyte %: 6.9 %
NEUTROS ABS: 4.8 10*3/uL (ref 1.4–6.5)
Neutrophil %: 89 %
PLATELETS: 149 10*3/uL — AB (ref 150–440)
RBC: 3.72 10*6/uL — AB (ref 3.80–5.20)
RDW: 20.2 % — ABNORMAL HIGH (ref 11.5–14.5)
WBC: 5.4 10*3/uL (ref 3.6–11.0)

## 2013-10-20 LAB — COMPREHENSIVE METABOLIC PANEL
Albumin: 1.5 g/dL — ABNORMAL LOW (ref 3.4–5.0)
Alkaline Phosphatase: 155 U/L — ABNORMAL HIGH
Anion Gap: 8 (ref 7–16)
BILIRUBIN TOTAL: 0.4 mg/dL (ref 0.2–1.0)
BUN: 37 mg/dL — ABNORMAL HIGH (ref 7–18)
Calcium, Total: 6.5 mg/dL — CL (ref 8.5–10.1)
Chloride: 97 mmol/L — ABNORMAL LOW (ref 98–107)
Co2: 31 mmol/L (ref 21–32)
Creatinine: 5.23 mg/dL — ABNORMAL HIGH (ref 0.60–1.30)
EGFR (African American): 10 — ABNORMAL LOW
EGFR (Non-African Amer.): 9 — ABNORMAL LOW
Glucose: 113 mg/dL — ABNORMAL HIGH (ref 65–99)
Osmolality: 281 (ref 275–301)
POTASSIUM: 3.5 mmol/L (ref 3.5–5.1)
SGOT(AST): 37 U/L (ref 15–37)
SGPT (ALT): 10 U/L — ABNORMAL LOW (ref 12–78)
Sodium: 136 mmol/L (ref 136–145)
TOTAL PROTEIN: 5.7 g/dL — AB (ref 6.4–8.2)

## 2013-10-20 LAB — CULTURE, BLOOD (SINGLE)

## 2013-10-20 LAB — PHOSPHORUS: PHOSPHORUS: 2.4 mg/dL — AB (ref 2.5–4.9)

## 2013-10-20 LAB — VANCOMYCIN, TROUGH: Vancomycin, Trough: 9 ug/mL — ABNORMAL LOW (ref 10–20)

## 2013-10-21 LAB — CBC WITH DIFFERENTIAL/PLATELET
Basophil #: 0 10*3/uL (ref 0.0–0.1)
Basophil %: 0.2 %
EOS PCT: 0.4 %
Eosinophil #: 0 10*3/uL (ref 0.0–0.7)
HCT: 30.4 % — ABNORMAL LOW (ref 35.0–47.0)
HGB: 9.5 g/dL — ABNORMAL LOW (ref 12.0–16.0)
Lymphocyte #: 0.2 10*3/uL — ABNORMAL LOW (ref 1.0–3.6)
Lymphocyte %: 5.7 %
MCH: 26.3 pg (ref 26.0–34.0)
MCHC: 31.2 g/dL — AB (ref 32.0–36.0)
MCV: 85 fL (ref 80–100)
MONOS PCT: 4.7 %
Monocyte #: 0.2 x10 3/mm (ref 0.2–0.9)
NEUTROS ABS: 3.6 10*3/uL (ref 1.4–6.5)
NEUTROS PCT: 89 %
PLATELETS: 128 10*3/uL — AB (ref 150–440)
RBC: 3.6 10*6/uL — AB (ref 3.80–5.20)
RDW: 20.1 % — ABNORMAL HIGH (ref 11.5–14.5)
WBC: 4.1 10*3/uL (ref 3.6–11.0)

## 2013-10-21 LAB — BASIC METABOLIC PANEL
Anion Gap: 4 — ABNORMAL LOW (ref 7–16)
BUN: 21 mg/dL — ABNORMAL HIGH (ref 7–18)
CHLORIDE: 100 mmol/L (ref 98–107)
CREATININE: 3.76 mg/dL — AB (ref 0.60–1.30)
Calcium, Total: 6.8 mg/dL — CL (ref 8.5–10.1)
Co2: 34 mmol/L — ABNORMAL HIGH (ref 21–32)
EGFR (Non-African Amer.): 13 — ABNORMAL LOW
GFR CALC AF AMER: 15 — AB
Glucose: 94 mg/dL (ref 65–99)
Osmolality: 278 (ref 275–301)
Potassium: 3 mmol/L — ABNORMAL LOW (ref 3.5–5.1)
Sodium: 138 mmol/L (ref 136–145)

## 2013-10-21 LAB — CLOSTRIDIUM DIFFICILE(ARMC)

## 2013-10-22 LAB — CBC WITH DIFFERENTIAL/PLATELET
Basophil #: 0 10*3/uL (ref 0.0–0.1)
Basophil %: 0.3 %
EOS PCT: 0.6 %
Eosinophil #: 0 10*3/uL (ref 0.0–0.7)
HCT: 30.4 % — ABNORMAL LOW (ref 35.0–47.0)
HGB: 9.2 g/dL — ABNORMAL LOW (ref 12.0–16.0)
Lymphocyte #: 0.4 10*3/uL — ABNORMAL LOW (ref 1.0–3.6)
Lymphocyte %: 7.9 %
MCH: 26.1 pg (ref 26.0–34.0)
MCHC: 30.4 g/dL — ABNORMAL LOW (ref 32.0–36.0)
MCV: 86 fL (ref 80–100)
Monocyte #: 0.2 x10 3/mm (ref 0.2–0.9)
Monocyte %: 5.4 %
NEUTROS ABS: 3.9 10*3/uL (ref 1.4–6.5)
Neutrophil %: 85.8 %
Platelet: 108 10*3/uL — ABNORMAL LOW (ref 150–440)
RBC: 3.54 10*6/uL — ABNORMAL LOW (ref 3.80–5.20)
RDW: 19.5 % — AB (ref 11.5–14.5)
WBC: 4.4 10*3/uL (ref 3.6–11.0)

## 2013-10-22 LAB — BASIC METABOLIC PANEL
ANION GAP: 7 (ref 7–16)
BUN: 26 mg/dL — ABNORMAL HIGH (ref 7–18)
CALCIUM: 7 mg/dL — AB (ref 8.5–10.1)
CHLORIDE: 103 mmol/L (ref 98–107)
Co2: 30 mmol/L (ref 21–32)
Creatinine: 4.9 mg/dL — ABNORMAL HIGH (ref 0.60–1.30)
EGFR (African American): 11 — ABNORMAL LOW
EGFR (Non-African Amer.): 10 — ABNORMAL LOW
GLUCOSE: 59 mg/dL — AB (ref 65–99)
Osmolality: 282 (ref 275–301)
Potassium: 3 mmol/L — ABNORMAL LOW (ref 3.5–5.1)
Sodium: 140 mmol/L (ref 136–145)

## 2013-10-22 LAB — PHOSPHORUS: PHOSPHORUS: 1 mg/dL — AB (ref 2.5–4.9)

## 2013-10-23 LAB — PHOSPHORUS: Phosphorus: 1.8 mg/dL — ABNORMAL LOW (ref 2.5–4.9)

## 2013-10-23 LAB — EXPECTORATED SPUTUM ASSESSMENT W GRAM STAIN, RFLX TO RESP C

## 2013-10-24 LAB — BASIC METABOLIC PANEL
Anion Gap: 9 (ref 7–16)
BUN: 27 mg/dL — AB (ref 7–18)
CALCIUM: 6.4 mg/dL — AB (ref 8.5–10.1)
CHLORIDE: 101 mmol/L (ref 98–107)
CO2: 29 mmol/L (ref 21–32)
CREATININE: 4.78 mg/dL — AB (ref 0.60–1.30)
EGFR (African American): 12 — ABNORMAL LOW
EGFR (Non-African Amer.): 10 — ABNORMAL LOW
GLUCOSE: 51 mg/dL — AB (ref 65–99)
OSMOLALITY: 280 (ref 275–301)
Potassium: 3.2 mmol/L — ABNORMAL LOW (ref 3.5–5.1)
Sodium: 139 mmol/L (ref 136–145)

## 2013-10-24 LAB — CBC WITH DIFFERENTIAL/PLATELET
Basophil #: 0 10*3/uL (ref 0.0–0.1)
Basophil %: 0.7 %
EOS ABS: 0 10*3/uL (ref 0.0–0.7)
Eosinophil %: 0.3 %
HCT: 27 % — AB (ref 35.0–47.0)
HGB: 8.5 g/dL — AB (ref 12.0–16.0)
LYMPHS PCT: 6.4 %
Lymphocyte #: 0.4 10*3/uL — ABNORMAL LOW (ref 1.0–3.6)
MCH: 26.8 pg (ref 26.0–34.0)
MCHC: 31.5 g/dL — ABNORMAL LOW (ref 32.0–36.0)
MCV: 85 fL (ref 80–100)
MONOS PCT: 4.3 %
Monocyte #: 0.3 x10 3/mm (ref 0.2–0.9)
NEUTROS PCT: 88.3 %
Neutrophil #: 6 10*3/uL (ref 1.4–6.5)
Platelet: 104 10*3/uL — ABNORMAL LOW (ref 150–440)
RBC: 3.17 10*6/uL — ABNORMAL LOW (ref 3.80–5.20)
RDW: 19.5 % — AB (ref 11.5–14.5)
WBC: 6.8 10*3/uL (ref 3.6–11.0)

## 2013-10-24 LAB — RAPID HIV-1/2 QL/CONFIRM: HIV-1/2,Rapid Ql: POSITIVE

## 2013-10-24 LAB — PHOSPHORUS: PHOSPHORUS: 2.9 mg/dL (ref 2.5–4.9)

## 2013-10-25 LAB — COMPREHENSIVE METABOLIC PANEL
ALBUMIN: 1.6 g/dL — AB (ref 3.4–5.0)
AST: 21 U/L (ref 15–37)
Alkaline Phosphatase: 130 U/L — ABNORMAL HIGH
Anion Gap: 9 (ref 7–16)
BUN: 37 mg/dL — AB (ref 7–18)
Bilirubin,Total: 1 mg/dL (ref 0.2–1.0)
CHLORIDE: 100 mmol/L (ref 98–107)
Calcium, Total: 6.2 mg/dL — CL (ref 8.5–10.1)
Co2: 29 mmol/L (ref 21–32)
Creatinine: 5.79 mg/dL — ABNORMAL HIGH (ref 0.60–1.30)
GFR CALC AF AMER: 9 — AB
GFR CALC NON AF AMER: 8 — AB
GLUCOSE: 44 mg/dL — AB (ref 65–99)
Osmolality: 281 (ref 275–301)
Potassium: 3.4 mmol/L — ABNORMAL LOW (ref 3.5–5.1)
SGPT (ALT): <6 U/L — ABNORMAL LOW (ref 12–78)
Sodium: 138 mmol/L (ref 136–145)
Total Protein: 5.5 g/dL — ABNORMAL LOW (ref 6.4–8.2)

## 2013-10-25 LAB — CBC WITH DIFFERENTIAL/PLATELET
BASOS ABS: 0 10*3/uL (ref 0.0–0.1)
Basophil %: 0.5 %
EOS ABS: 0 10*3/uL (ref 0.0–0.7)
EOS PCT: 0 %
HCT: 30.9 % — ABNORMAL LOW (ref 35.0–47.0)
HGB: 9.9 g/dL — ABNORMAL LOW (ref 12.0–16.0)
Lymphocyte #: 0.3 10*3/uL — ABNORMAL LOW (ref 1.0–3.6)
Lymphocyte %: 5 %
MCH: 27 pg (ref 26.0–34.0)
MCHC: 31.9 g/dL — ABNORMAL LOW (ref 32.0–36.0)
MCV: 85 fL (ref 80–100)
MONO ABS: 0.5 x10 3/mm (ref 0.2–0.9)
MONOS PCT: 7.6 %
NEUTROS ABS: 5.2 10*3/uL (ref 1.4–6.5)
Neutrophil %: 86.9 %
Platelet: 88 10*3/uL — ABNORMAL LOW (ref 150–440)
RBC: 3.65 10*6/uL — AB (ref 3.80–5.20)
RDW: 19.8 % — AB (ref 11.5–14.5)
WBC: 6 10*3/uL (ref 3.6–11.0)

## 2013-10-25 LAB — RENAL FUNCTION PANEL
Albumin: 1.6 g/dL — ABNORMAL LOW (ref 3.4–5.0)
Anion Gap: 5 — ABNORMAL LOW (ref 7–16)
BUN: 22 mg/dL — AB (ref 7–18)
CREATININE: 3.58 mg/dL — AB (ref 0.60–1.30)
Calcium, Total: 6.4 mg/dL — CL (ref 8.5–10.1)
Chloride: 100 mmol/L (ref 98–107)
Co2: 33 mmol/L — ABNORMAL HIGH (ref 21–32)
EGFR (African American): 16 — ABNORMAL LOW
EGFR (Non-African Amer.): 14 — ABNORMAL LOW
Glucose: 47 mg/dL — ABNORMAL LOW (ref 65–99)
Osmolality: 276 (ref 275–301)
PHOSPHORUS: 2.1 mg/dL — AB (ref 2.5–4.9)
Potassium: 3.1 mmol/L — ABNORMAL LOW (ref 3.5–5.1)
Sodium: 138 mmol/L (ref 136–145)

## 2013-10-25 LAB — AMMONIA: AMMONIA, PLASMA: 17 umol/L (ref 11–32)

## 2013-10-26 LAB — STOOL CULTURE

## 2013-10-27 LAB — CBC WITH DIFFERENTIAL/PLATELET
Basophil #: 0 10*3/uL (ref 0.0–0.1)
Basophil %: 0.4 %
Eosinophil #: 0 10*3/uL (ref 0.0–0.7)
Eosinophil %: 0.3 %
HCT: 29 % — ABNORMAL LOW (ref 35.0–47.0)
HGB: 9.3 g/dL — AB (ref 12.0–16.0)
LYMPHS ABS: 0.4 10*3/uL — AB (ref 1.0–3.6)
LYMPHS PCT: 5.3 %
MCH: 27.2 pg (ref 26.0–34.0)
MCHC: 32.1 g/dL (ref 32.0–36.0)
MCV: 85 fL (ref 80–100)
MONO ABS: 0.3 x10 3/mm (ref 0.2–0.9)
MONOS PCT: 4 %
NEUTROS PCT: 90 %
Neutrophil #: 6.8 10*3/uL — ABNORMAL HIGH (ref 1.4–6.5)
Platelet: 60 10*3/uL — ABNORMAL LOW (ref 150–440)
RBC: 3.42 10*6/uL — AB (ref 3.80–5.20)
RDW: 20.4 % — AB (ref 11.5–14.5)
WBC: 7.5 10*3/uL (ref 3.6–11.0)

## 2013-10-27 LAB — RENAL FUNCTION PANEL
Albumin: 1.5 g/dL — ABNORMAL LOW (ref 3.4–5.0)
Anion Gap: 7 (ref 7–16)
BUN: 28 mg/dL — ABNORMAL HIGH (ref 7–18)
CREATININE: 4.63 mg/dL — AB (ref 0.60–1.30)
Calcium, Total: 6.1 mg/dL — CL (ref 8.5–10.1)
Chloride: 100 mmol/L (ref 98–107)
Co2: 30 mmol/L (ref 21–32)
EGFR (Non-African Amer.): 10 — ABNORMAL LOW
GFR CALC AF AMER: 12 — AB
GLUCOSE: 51 mg/dL — AB (ref 65–99)
Osmolality: 277 (ref 275–301)
Phosphorus: 2 mg/dL — ABNORMAL LOW (ref 2.5–4.9)
Potassium: 3.2 mmol/L — ABNORMAL LOW (ref 3.5–5.1)
SODIUM: 137 mmol/L (ref 136–145)

## 2013-10-28 LAB — CBC WITH DIFFERENTIAL/PLATELET
Basophil #: 0.1 10*3/uL (ref 0.0–0.1)
Basophil %: 0.7 %
EOS PCT: 0.1 %
Eosinophil #: 0 10*3/uL (ref 0.0–0.7)
HCT: 27.5 % — ABNORMAL LOW (ref 35.0–47.0)
HGB: 8.9 g/dL — ABNORMAL LOW (ref 12.0–16.0)
Lymphocyte #: 0.7 10*3/uL — ABNORMAL LOW (ref 1.0–3.6)
Lymphocyte %: 9 %
MCH: 27.3 pg (ref 26.0–34.0)
MCHC: 32.4 g/dL (ref 32.0–36.0)
MCV: 84 fL (ref 80–100)
Monocyte #: 0.5 x10 3/mm (ref 0.2–0.9)
Monocyte %: 7.1 %
NEUTROS ABS: 6.2 10*3/uL (ref 1.4–6.5)
Neutrophil %: 83.1 %
Platelet: 53 10*3/uL — ABNORMAL LOW (ref 150–440)
RBC: 3.26 10*6/uL — ABNORMAL LOW (ref 3.80–5.20)
RDW: 19.8 % — ABNORMAL HIGH (ref 11.5–14.5)
WBC: 7.5 10*3/uL (ref 3.6–11.0)

## 2013-10-29 LAB — RENAL FUNCTION PANEL
ALBUMIN: 1.4 g/dL — AB (ref 3.4–5.0)
ANION GAP: 6 — AB (ref 7–16)
BUN: 21 mg/dL — AB (ref 7–18)
CALCIUM: 6.7 mg/dL — AB (ref 8.5–10.1)
CHLORIDE: 99 mmol/L (ref 98–107)
CREATININE: 3.77 mg/dL — AB (ref 0.60–1.30)
Co2: 31 mmol/L (ref 21–32)
EGFR (African American): 15 — ABNORMAL LOW
GFR CALC NON AF AMER: 13 — AB
GLUCOSE: 77 mg/dL (ref 65–99)
Osmolality: 274 (ref 275–301)
PHOSPHORUS: 1.4 mg/dL — AB (ref 2.5–4.9)
Potassium: 3.6 mmol/L (ref 3.5–5.1)
SODIUM: 136 mmol/L (ref 136–145)

## 2013-10-29 LAB — CBC WITH DIFFERENTIAL/PLATELET
BASOS PCT: 0.4 %
BASOS PCT: 0.6 %
Basophil #: 0 10*3/uL (ref 0.0–0.1)
Basophil #: 0 10*3/uL (ref 0.0–0.1)
EOS ABS: 0 10*3/uL (ref 0.0–0.7)
Eosinophil #: 0 10*3/uL (ref 0.0–0.7)
Eosinophil %: 0.3 %
Eosinophil %: 0.2 %
HCT: 29 % — AB (ref 35.0–47.0)
HCT: 26.5 % — ABNORMAL LOW (ref 35.0–47.0)
HGB: 9 g/dL — AB (ref 12.0–16.0)
HGB: 8.2 g/dL — AB (ref 12.0–16.0)
LYMPHS ABS: 0.2 10*3/uL — AB (ref 1.0–3.6)
Lymphocyte #: 0.3 10*3/uL — ABNORMAL LOW (ref 1.0–3.6)
Lymphocyte %: 4.1 %
Lymphocyte %: 3.5 %
MCH: 25.9 pg — ABNORMAL LOW (ref 26.0–34.0)
MCH: 26.2 pg (ref 26.0–34.0)
MCHC: 30.9 g/dL — AB (ref 32.0–36.0)
MCHC: 31 g/dL — AB (ref 32.0–36.0)
MCV: 84 fL (ref 80–100)
MCV: 84 fL (ref 80–100)
MONO ABS: 0.2 x10 3/mm (ref 0.2–0.9)
MONOS PCT: 4.3 %
Monocyte #: 0.3 x10 3/mm (ref 0.2–0.9)
Monocyte %: 2.7 %
NEUTROS ABS: 5.3 10*3/uL (ref 1.4–6.5)
NEUTROS PCT: 93 %
Neutrophil #: 6.3 10*3/uL (ref 1.4–6.5)
Neutrophil %: 90.9 %
Platelet: 59 10*3/uL — ABNORMAL LOW (ref 150–440)
Platelet: 45 10*3/uL — ABNORMAL LOW (ref 150–440)
RBC: 3.46 10*6/uL — ABNORMAL LOW (ref 3.80–5.20)
RBC: 3.13 10*6/uL — ABNORMAL LOW (ref 3.80–5.20)
RDW: 20.5 % — AB (ref 11.5–14.5)
RDW: 20.2 % — AB (ref 11.5–14.5)
WBC: 6.9 10*3/uL (ref 3.6–11.0)
WBC: 5.7 10*3/uL (ref 3.6–11.0)

## 2013-10-30 LAB — CBC WITH DIFFERENTIAL/PLATELET
BASOS PCT: 0.5 %
Basophil #: 0 10*3/uL (ref 0.0–0.1)
EOS ABS: 0 10*3/uL (ref 0.0–0.7)
Eosinophil %: 0.2 %
HCT: 26.3 % — AB (ref 35.0–47.0)
HGB: 8.1 g/dL — ABNORMAL LOW (ref 12.0–16.0)
LYMPHS ABS: 0.3 10*3/uL — AB (ref 1.0–3.6)
LYMPHS PCT: 4.7 %
MCH: 26.2 pg (ref 26.0–34.0)
MCHC: 30.9 g/dL — AB (ref 32.0–36.0)
MCV: 85 fL (ref 80–100)
MONO ABS: 0.3 x10 3/mm (ref 0.2–0.9)
Monocyte %: 5.5 %
Neutrophil #: 4.9 10*3/uL (ref 1.4–6.5)
Neutrophil %: 89.1 %
Platelet: 57 10*3/uL — ABNORMAL LOW (ref 150–440)
RBC: 3.11 10*6/uL — AB (ref 3.80–5.20)
RDW: 20.9 % — ABNORMAL HIGH (ref 11.5–14.5)
WBC: 5.5 10*3/uL (ref 3.6–11.0)

## 2013-10-30 LAB — CLOSTRIDIUM DIFFICILE(ARMC)

## 2013-11-01 LAB — CBC WITH DIFFERENTIAL/PLATELET
BASOS ABS: 0.1 10*3/uL (ref 0.0–0.1)
Basophil %: 1.1 %
Eosinophil #: 0 10*3/uL (ref 0.0–0.7)
Eosinophil %: 0.1 %
HCT: 26.1 % — AB (ref 35.0–47.0)
HGB: 8.1 g/dL — AB (ref 12.0–16.0)
LYMPHS ABS: 0.7 10*3/uL — AB (ref 1.0–3.6)
LYMPHS PCT: 10.7 %
MCH: 26.3 pg (ref 26.0–34.0)
MCHC: 31.2 g/dL — ABNORMAL LOW (ref 32.0–36.0)
MCV: 84 fL (ref 80–100)
Monocyte #: 0.4 x10 3/mm (ref 0.2–0.9)
Monocyte %: 6.7 %
NEUTROS ABS: 5.3 10*3/uL (ref 1.4–6.5)
Neutrophil %: 81.4 %
Platelet: 67 10*3/uL — ABNORMAL LOW (ref 150–440)
RBC: 3.1 10*6/uL — ABNORMAL LOW (ref 3.80–5.20)
RDW: 20.4 % — ABNORMAL HIGH (ref 11.5–14.5)
WBC: 6.5 10*3/uL (ref 3.6–11.0)

## 2013-11-01 LAB — CLOSTRIDIUM DIFFICILE(ARMC)

## 2013-11-01 LAB — PHOSPHORUS: Phosphorus: 1.8 mg/dL — ABNORMAL LOW (ref 2.5–4.9)

## 2013-11-03 LAB — CBC WITH DIFFERENTIAL/PLATELET
BASOS PCT: 1.4 %
Basophil #: 0.1 10*3/uL (ref 0.0–0.1)
EOS ABS: 0 10*3/uL (ref 0.0–0.7)
EOS PCT: 0.2 %
HCT: 25.9 % — AB (ref 35.0–47.0)
HGB: 7.8 g/dL — ABNORMAL LOW (ref 12.0–16.0)
LYMPHS ABS: 0.7 10*3/uL — AB (ref 1.0–3.6)
Lymphocyte %: 12.8 %
MCH: 25.9 pg — ABNORMAL LOW (ref 26.0–34.0)
MCHC: 30.1 g/dL — ABNORMAL LOW (ref 32.0–36.0)
MCV: 86 fL (ref 80–100)
MONOS PCT: 7.9 %
Monocyte #: 0.4 x10 3/mm (ref 0.2–0.9)
Neutrophil #: 4.3 10*3/uL (ref 1.4–6.5)
Neutrophil %: 77.7 %
Platelet: 72 10*3/uL — ABNORMAL LOW (ref 150–440)
RBC: 3.01 10*6/uL — ABNORMAL LOW (ref 3.80–5.20)
RDW: 20.9 % — AB (ref 11.5–14.5)
WBC: 5.5 10*3/uL (ref 3.6–11.0)

## 2013-11-03 LAB — PHOSPHORUS: PHOSPHORUS: 1.5 mg/dL — AB (ref 2.5–4.9)

## 2013-11-04 LAB — CBC WITH DIFFERENTIAL/PLATELET
Basophil #: 0.1 10*3/uL (ref 0.0–0.1)
Basophil %: 1.1 %
Eosinophil #: 0 10*3/uL (ref 0.0–0.7)
Eosinophil %: 0.5 %
HCT: 27.4 % — ABNORMAL LOW (ref 35.0–47.0)
HGB: 8.3 g/dL — AB (ref 12.0–16.0)
Lymphocyte #: 1.1 10*3/uL (ref 1.0–3.6)
Lymphocyte %: 15.6 %
MCH: 26.1 pg (ref 26.0–34.0)
MCHC: 30.4 g/dL — ABNORMAL LOW (ref 32.0–36.0)
MCV: 86 fL (ref 80–100)
Monocyte #: 0.6 x10 3/mm (ref 0.2–0.9)
Monocyte %: 8.1 %
Neutrophil #: 5.1 10*3/uL (ref 1.4–6.5)
Neutrophil %: 74.7 %
Platelet: 61 10*3/uL — ABNORMAL LOW (ref 150–440)
RBC: 3.19 10*6/uL — AB (ref 3.80–5.20)
RDW: 21.7 % — ABNORMAL HIGH (ref 11.5–14.5)
WBC: 6.9 10*3/uL (ref 3.6–11.0)

## 2013-11-04 LAB — BASIC METABOLIC PANEL
ANION GAP: 6 — AB (ref 7–16)
BUN: 14 mg/dL (ref 7–18)
CHLORIDE: 106 mmol/L (ref 98–107)
Calcium, Total: 6.6 mg/dL — CL (ref 8.5–10.1)
Co2: 32 mmol/L (ref 21–32)
Creatinine: 3.83 mg/dL — ABNORMAL HIGH (ref 0.60–1.30)
EGFR (Non-African Amer.): 13 — ABNORMAL LOW
GFR CALC AF AMER: 15 — AB
Glucose: 57 mg/dL — ABNORMAL LOW (ref 65–99)
OSMOLALITY: 285 (ref 275–301)
Potassium: 3.7 mmol/L (ref 3.5–5.1)
Sodium: 144 mmol/L (ref 136–145)

## 2013-11-04 LAB — PHOSPHORUS: PHOSPHORUS: 1 mg/dL — AB (ref 2.5–4.9)

## 2013-11-04 LAB — MAGNESIUM: Magnesium: 1.7 mg/dL — ABNORMAL LOW

## 2013-11-05 LAB — CBC WITH DIFFERENTIAL/PLATELET
BANDS NEUTROPHIL: 9 %
Basophil: 1 %
HCT: 28.2 % — AB (ref 35.0–47.0)
HGB: 8.5 g/dL — AB (ref 12.0–16.0)
Lymphocytes: 9 %
MCH: 26.1 pg (ref 26.0–34.0)
MCHC: 30.1 g/dL — AB (ref 32.0–36.0)
MCV: 87 fL (ref 80–100)
MONOS PCT: 5 %
Metamyelocyte: 1 %
Myelocyte: 1 %
NRBC/100 WBC: 1 /
PLATELETS: 80 10*3/uL — AB (ref 150–440)
RBC: 3.25 10*6/uL — ABNORMAL LOW (ref 3.80–5.20)
RDW: 21.9 % — AB (ref 11.5–14.5)
SEGMENTED NEUTROPHILS: 74 %
WBC: 7 10*3/uL (ref 3.6–11.0)

## 2013-11-05 LAB — BASIC METABOLIC PANEL
Anion Gap: 7 (ref 7–16)
BUN: 22 mg/dL — AB (ref 7–18)
Calcium, Total: 6.8 mg/dL — CL (ref 8.5–10.1)
Chloride: 108 mmol/L — ABNORMAL HIGH (ref 98–107)
Co2: 28 mmol/L (ref 21–32)
Creatinine: 4.89 mg/dL — ABNORMAL HIGH (ref 0.60–1.30)
GFR CALC AF AMER: 11 — AB
GFR CALC NON AF AMER: 10 — AB
Glucose: 49 mg/dL — ABNORMAL LOW (ref 65–99)
Osmolality: 286 (ref 275–301)
Potassium: 3.9 mmol/L (ref 3.5–5.1)
Sodium: 143 mmol/L (ref 136–145)

## 2013-11-05 LAB — MAGNESIUM: MAGNESIUM: 1.8 mg/dL

## 2013-11-05 LAB — PHOSPHORUS: Phosphorus: 1.3 mg/dL — ABNORMAL LOW (ref 2.5–4.9)

## 2013-11-07 LAB — CBC WITH DIFFERENTIAL/PLATELET
BASOS ABS: 0.1 10*3/uL (ref 0.0–0.1)
Basophil %: 1 %
Eosinophil #: 0 10*3/uL (ref 0.0–0.7)
Eosinophil %: 0.2 %
HCT: 27.8 % — ABNORMAL LOW (ref 35.0–47.0)
HGB: 8.6 g/dL — AB (ref 12.0–16.0)
LYMPHS ABS: 0.7 10*3/uL — AB (ref 1.0–3.6)
LYMPHS PCT: 11.6 %
MCH: 27.1 pg (ref 26.0–34.0)
MCHC: 31.1 g/dL — ABNORMAL LOW (ref 32.0–36.0)
MCV: 87 fL (ref 80–100)
MONO ABS: 0.5 x10 3/mm (ref 0.2–0.9)
MONOS PCT: 9.2 %
NEUTROS ABS: 4.5 10*3/uL (ref 1.4–6.5)
Neutrophil %: 78 %
Platelet: 102 10*3/uL — ABNORMAL LOW (ref 150–440)
RBC: 3.19 10*6/uL — AB (ref 3.80–5.20)
RDW: 22.5 % — ABNORMAL HIGH (ref 11.5–14.5)
WBC: 5.8 10*3/uL (ref 3.6–11.0)

## 2013-11-07 LAB — BASIC METABOLIC PANEL
ANION GAP: 5 — AB (ref 7–16)
BUN: 20 mg/dL — ABNORMAL HIGH (ref 7–18)
CALCIUM: 6.6 mg/dL — AB (ref 8.5–10.1)
CHLORIDE: 104 mmol/L (ref 98–107)
CREATININE: 4.32 mg/dL — AB (ref 0.60–1.30)
Co2: 33 mmol/L — ABNORMAL HIGH (ref 21–32)
GFR CALC AF AMER: 13 — AB
GFR CALC NON AF AMER: 11 — AB
GLUCOSE: 44 mg/dL — AB (ref 65–99)
Osmolality: 283 (ref 275–301)
POTASSIUM: 3.7 mmol/L (ref 3.5–5.1)
SODIUM: 142 mmol/L (ref 136–145)

## 2013-11-07 LAB — HEMOGLOBIN A1C

## 2013-11-08 LAB — CBC WITH DIFFERENTIAL/PLATELET
Bands: 1 %
HCT: 28.3 % — ABNORMAL LOW (ref 35.0–47.0)
HGB: 8.6 g/dL — AB (ref 12.0–16.0)
LYMPHS PCT: 10 %
MCH: 26.6 pg (ref 26.0–34.0)
MCHC: 30.5 g/dL — ABNORMAL LOW (ref 32.0–36.0)
MCV: 87 fL (ref 80–100)
MYELOCYTE: 1 %
Metamyelocyte: 2 %
Monocytes: 4 %
NRBC/100 WBC: 1 /
Platelet: 106 10*3/uL — ABNORMAL LOW (ref 150–440)
RBC: 3.24 10*6/uL — AB (ref 3.80–5.20)
RDW: 21.9 % — ABNORMAL HIGH (ref 11.5–14.5)
Segmented Neutrophils: 82 %
WBC: 4.9 10*3/uL (ref 3.6–11.0)

## 2013-11-08 LAB — PHOSPHORUS: Phosphorus: 2 mg/dL — ABNORMAL LOW (ref 2.5–4.9)

## 2013-11-09 ENCOUNTER — Encounter: Payer: Self-pay | Admitting: Internal Medicine

## 2013-11-09 ENCOUNTER — Ambulatory Visit: Payer: Self-pay | Admitting: Internal Medicine

## 2013-11-09 LAB — BASIC METABOLIC PANEL
ANION GAP: 4 — AB (ref 7–16)
BUN: 14 mg/dL (ref 7–18)
CHLORIDE: 101 mmol/L (ref 98–107)
Calcium, Total: 6.5 mg/dL — CL (ref 8.5–10.1)
Co2: 34 mmol/L — ABNORMAL HIGH (ref 21–32)
Creatinine: 3.28 mg/dL — ABNORMAL HIGH (ref 0.60–1.30)
EGFR (African American): 18 — ABNORMAL LOW
EGFR (Non-African Amer.): 16 — ABNORMAL LOW
GLUCOSE: 74 mg/dL (ref 65–99)
Osmolality: 277 (ref 275–301)
POTASSIUM: 3.6 mmol/L (ref 3.5–5.1)
SODIUM: 139 mmol/L (ref 136–145)

## 2013-11-09 LAB — CBC WITH DIFFERENTIAL/PLATELET
BANDS NEUTROPHIL: 6 %
BASOS ABS: 1 %
HCT: 28.7 % — ABNORMAL LOW (ref 35.0–47.0)
HGB: 8.9 g/dL — ABNORMAL LOW (ref 12.0–16.0)
Lymphocytes: 16 %
MCH: 27.4 pg (ref 26.0–34.0)
MCHC: 30.9 g/dL — AB (ref 32.0–36.0)
MCV: 89 fL (ref 80–100)
Monocytes: 9 %
NRBC/100 WBC: 1 /
Platelet: 81 10*3/uL — ABNORMAL LOW (ref 150–440)
RBC: 3.24 10*6/uL — ABNORMAL LOW (ref 3.80–5.20)
RDW: 21.6 % — ABNORMAL HIGH (ref 11.5–14.5)
SEGMENTED NEUTROPHILS: 68 %
WBC: 5.2 10*3/uL (ref 3.6–11.0)

## 2013-11-09 LAB — CULTURE, FUNGUS WITHOUT SMEAR

## 2013-11-10 LAB — CBC WITH DIFFERENTIAL/PLATELET
BASOS ABS: 2 %
HCT: 27.7 % — AB (ref 35.0–47.0)
HGB: 8.3 g/dL — ABNORMAL LOW (ref 12.0–16.0)
LYMPHS PCT: 13 %
MCH: 26.4 pg (ref 26.0–34.0)
MCHC: 29.9 g/dL — ABNORMAL LOW (ref 32.0–36.0)
MCV: 88 fL (ref 80–100)
MONOS PCT: 5 %
Metamyelocyte: 2 %
Platelet: 84 10*3/uL — ABNORMAL LOW (ref 150–440)
RBC: 3.13 10*6/uL — AB (ref 3.80–5.20)
RDW: 21.9 % — AB (ref 11.5–14.5)
Segmented Neutrophils: 78 %
WBC: 4.2 10*3/uL (ref 3.6–11.0)

## 2013-11-10 LAB — PLATELET COUNT: Platelet: 111 10*3/uL — ABNORMAL LOW (ref 150–440)

## 2013-11-11 LAB — BASIC METABOLIC PANEL
ANION GAP: 8 (ref 7–16)
BUN: 31 mg/dL — AB (ref 7–18)
Calcium, Total: 6.7 mg/dL — CL (ref 8.5–10.1)
Chloride: 94 mmol/L — ABNORMAL LOW (ref 98–107)
Co2: 29 mmol/L (ref 21–32)
Creatinine: 5.34 mg/dL — ABNORMAL HIGH (ref 0.60–1.30)
EGFR (African American): 10 — ABNORMAL LOW
EGFR (Non-African Amer.): 9 — ABNORMAL LOW
Glucose: 56 mg/dL — ABNORMAL LOW (ref 65–99)
OSMOLALITY: 267 (ref 275–301)
Potassium: 4.1 mmol/L (ref 3.5–5.1)
Sodium: 131 mmol/L — ABNORMAL LOW (ref 136–145)

## 2013-11-11 LAB — CBC WITH DIFFERENTIAL/PLATELET
Basophil #: 0 10*3/uL (ref 0.0–0.1)
Basophil #: 0 10*3/uL (ref 0.0–0.1)
Basophil %: 1.1 %
Basophil %: 1.1 %
EOS ABS: 0 10*3/uL (ref 0.0–0.7)
Eosinophil #: 0 10*3/uL (ref 0.0–0.7)
Eosinophil %: 0.1 %
Eosinophil %: 0.1 %
HCT: 27 % — ABNORMAL LOW (ref 35.0–47.0)
HCT: 26 % — AB (ref 35.0–47.0)
HGB: 8.6 g/dL — AB (ref 12.0–16.0)
HGB: 8.2 g/dL — ABNORMAL LOW (ref 12.0–16.0)
LYMPHS ABS: 0.5 10*3/uL — AB (ref 1.0–3.6)
LYMPHS PCT: 13.8 %
Lymphocyte #: 0.6 10*3/uL — ABNORMAL LOW (ref 1.0–3.6)
Lymphocyte %: 15.5 %
MCH: 27.7 pg (ref 26.0–34.0)
MCH: 27.3 pg (ref 26.0–34.0)
MCHC: 31.8 g/dL — ABNORMAL LOW (ref 32.0–36.0)
MCHC: 31.4 g/dL — ABNORMAL LOW (ref 32.0–36.0)
MCV: 87 fL (ref 80–100)
MCV: 87 fL (ref 80–100)
Monocyte #: 0.3 x10 3/mm (ref 0.2–0.9)
Monocyte #: 0.3 x10 3/mm (ref 0.2–0.9)
Monocyte %: 7.4 %
Monocyte %: 7.8 %
Neutrophil #: 2.9 10*3/uL (ref 1.4–6.5)
Neutrophil #: 3 10*3/uL (ref 1.4–6.5)
Neutrophil %: 75.9 %
Neutrophil %: 77.2 %
PLATELETS: 94 10*3/uL — AB (ref 150–440)
Platelet: 96 10*3/uL — ABNORMAL LOW (ref 150–440)
RBC: 3.1 10*6/uL — ABNORMAL LOW (ref 3.80–5.20)
RBC: 3 10*6/uL — ABNORMAL LOW (ref 3.80–5.20)
RDW: 21.4 % — AB (ref 11.5–14.5)
RDW: 21.4 % — ABNORMAL HIGH (ref 11.5–14.5)
WBC: 3.8 10*3/uL (ref 3.6–11.0)
WBC: 3.9 10*3/uL (ref 3.6–11.0)

## 2013-11-12 LAB — CBC WITH DIFFERENTIAL/PLATELET
BANDS NEUTROPHIL: 10 %
Basophil #: 0 10*3/uL (ref 0.0–0.1)
Basophil %: 0.9 %
EOS ABS: 0 10*3/uL (ref 0.0–0.7)
Eosinophil %: 0.2 %
HCT: 27.2 % — AB (ref 35.0–47.0)
HCT: 27.4 % — ABNORMAL LOW (ref 35.0–47.0)
HGB: 8.6 g/dL — ABNORMAL LOW (ref 12.0–16.0)
HGB: 8.4 g/dL — ABNORMAL LOW (ref 12.0–16.0)
LYMPHS PCT: 9 %
LYMPHS PCT: 13.9 %
Lymphocyte #: 0.5 10*3/uL — ABNORMAL LOW (ref 1.0–3.6)
MCH: 27.5 pg (ref 26.0–34.0)
MCH: 27 pg (ref 26.0–34.0)
MCHC: 31.6 g/dL — AB (ref 32.0–36.0)
MCHC: 30.7 g/dL — ABNORMAL LOW (ref 32.0–36.0)
MCV: 87 fL (ref 80–100)
MCV: 88 fL (ref 80–100)
Metamyelocyte: 1 %
Monocyte #: 0.3 x10 3/mm (ref 0.2–0.9)
Monocyte %: 8.3 %
Monocytes: 5 %
Myelocyte: 1 %
Neutrophil #: 2.8 10*3/uL (ref 1.4–6.5)
Neutrophil %: 76.7 %
PLATELETS: 75 10*3/uL — AB (ref 150–440)
Platelet: 82 10*3/uL — ABNORMAL LOW (ref 150–440)
RBC: 3.13 10*6/uL — ABNORMAL LOW (ref 3.80–5.20)
RBC: 3.11 10*6/uL — AB (ref 3.80–5.20)
RDW: 21 % — ABNORMAL HIGH (ref 11.5–14.5)
RDW: 20.6 % — ABNORMAL HIGH (ref 11.5–14.5)
Segmented Neutrophils: 74 %
WBC: 3.4 10*3/uL — AB (ref 3.6–11.0)
WBC: 3.6 10*3/uL (ref 3.6–11.0)

## 2013-11-13 LAB — CBC WITH DIFFERENTIAL/PLATELET
Basophil #: 0.1 x10 3/mm 3
Basophil %: 1.1 %
Eosinophil #: 0 x10 3/mm 3
Eosinophil %: 0 %
HCT: 26.2 % — ABNORMAL LOW
HGB: 8.2 g/dL — ABNORMAL LOW
Lymphocyte %: 10.2 %
Lymphs Abs: 0.5 x10 3/mm 3 — ABNORMAL LOW
MCH: 27.7 pg
MCHC: 31.2 g/dL — ABNORMAL LOW
MCV: 89 fL
Monocyte #: 0.5 "x10 3/mm "
Monocyte %: 9.7 %
Neutrophil #: 3.9 x10 3/mm 3
Neutrophil %: 79 %
Platelet: 78 x10 3/mm 3 — ABNORMAL LOW
RBC: 2.96 X10 6/mm 3 — ABNORMAL LOW
RDW: 20.7 % — ABNORMAL HIGH
WBC: 5 x10 3/mm 3

## 2013-11-13 LAB — BASIC METABOLIC PANEL
Anion Gap: 8 (ref 7–16)
BUN: 11 mg/dL (ref 7–18)
CREATININE: 2.85 mg/dL — AB (ref 0.60–1.30)
Calcium, Total: 6.5 mg/dL — CL (ref 8.5–10.1)
Chloride: 95 mmol/L — ABNORMAL LOW (ref 98–107)
Co2: 28 mmol/L (ref 21–32)
EGFR (African American): 22 — ABNORMAL LOW
EGFR (Non-African Amer.): 19 — ABNORMAL LOW
GLUCOSE: 84 mg/dL (ref 65–99)
Osmolality: 261 (ref 275–301)
Potassium: 3.4 mmol/L — ABNORMAL LOW (ref 3.5–5.1)
Sodium: 131 mmol/L — ABNORMAL LOW (ref 136–145)

## 2013-11-15 LAB — RENAL FUNCTION PANEL
Albumin: 1.2 g/dL — ABNORMAL LOW (ref 3.4–5.0)
Anion Gap: 9 (ref 7–16)
BUN: 29 mg/dL — ABNORMAL HIGH (ref 7–18)
CHLORIDE: 84 mmol/L — AB (ref 98–107)
CREATININE: 4.83 mg/dL — AB (ref 0.60–1.30)
Calcium, Total: 6.3 mg/dL — CL (ref 8.5–10.1)
Co2: 25 mmol/L (ref 21–32)
EGFR (African American): 11 — ABNORMAL LOW
EGFR (Non-African Amer.): 10 — ABNORMAL LOW
GLUCOSE: 107 mg/dL — AB (ref 65–99)
Osmolality: 245 (ref 275–301)
PHOSPHORUS: 3.5 mg/dL (ref 2.5–4.9)
POTASSIUM: 3.7 mmol/L (ref 3.5–5.1)
SODIUM: 118 mmol/L — AB (ref 136–145)

## 2013-11-15 LAB — CBC WITH DIFFERENTIAL/PLATELET
BASOS ABS: 0 10*3/uL (ref 0.0–0.1)
BASOS PCT: 0.7 %
Basophil #: 0 10*3/uL (ref 0.0–0.1)
Basophil %: 0.6 %
Eosinophil #: 0 10*3/uL (ref 0.0–0.7)
Eosinophil #: 0 10*3/uL (ref 0.0–0.7)
Eosinophil %: 0.1 %
Eosinophil %: 0.1 %
HCT: 23.8 % — AB (ref 35.0–47.0)
HCT: 25.2 % — ABNORMAL LOW (ref 35.0–47.0)
HGB: 7.6 g/dL — ABNORMAL LOW (ref 12.0–16.0)
HGB: 7.8 g/dL — AB (ref 12.0–16.0)
LYMPHS ABS: 0.3 10*3/uL — AB (ref 1.0–3.6)
LYMPHS PCT: 11.9 %
Lymphocyte #: 0.4 10*3/uL — ABNORMAL LOW (ref 1.0–3.6)
Lymphocyte %: 8 %
MCH: 27.6 pg (ref 26.0–34.0)
MCH: 26.6 pg (ref 26.0–34.0)
MCHC: 32.1 g/dL (ref 32.0–36.0)
MCHC: 31 g/dL — ABNORMAL LOW (ref 32.0–36.0)
MCV: 86 fL (ref 80–100)
MCV: 86 fL (ref 80–100)
MONO ABS: 0.3 x10 3/mm (ref 0.2–0.9)
MONOS PCT: 8.8 %
Monocyte #: 0.3 x10 3/mm (ref 0.2–0.9)
Monocyte %: 9 %
NEUTROS PCT: 82.4 %
Neutrophil #: 2.9 10*3/uL (ref 1.4–6.5)
Neutrophil #: 2.7 10*3/uL (ref 1.4–6.5)
Neutrophil %: 78.4 %
PLATELETS: 72 10*3/uL — AB (ref 150–440)
Platelet: 75 10*3/uL — ABNORMAL LOW (ref 150–440)
RBC: 2.76 10*6/uL — AB (ref 3.80–5.20)
RBC: 2.94 10*6/uL — AB (ref 3.80–5.20)
RDW: 20.1 % — AB (ref 11.5–14.5)
RDW: 19.7 % — ABNORMAL HIGH (ref 11.5–14.5)
WBC: 3.6 10*3/uL (ref 3.6–11.0)
WBC: 3.3 10*3/uL — AB (ref 3.6–11.0)

## 2013-11-16 LAB — CBC WITH DIFFERENTIAL/PLATELET
BASOS PCT: 0.7 %
Basophil #: 0 10*3/uL (ref 0.0–0.1)
EOS ABS: 0 10*3/uL (ref 0.0–0.7)
Eosinophil %: 0 %
HCT: 26.6 % — ABNORMAL LOW (ref 35.0–47.0)
HGB: 8.4 g/dL — ABNORMAL LOW (ref 12.0–16.0)
LYMPHS PCT: 9.1 %
Lymphocyte #: 0.3 10*3/uL — ABNORMAL LOW (ref 1.0–3.6)
MCH: 26.9 pg (ref 26.0–34.0)
MCHC: 31.5 g/dL — AB (ref 32.0–36.0)
MCV: 85 fL (ref 80–100)
Monocyte #: 0.2 x10 3/mm (ref 0.2–0.9)
Monocyte %: 7.8 %
NEUTROS ABS: 2.6 10*3/uL (ref 1.4–6.5)
NEUTROS PCT: 82.4 %
Platelet: 79 10*3/uL — ABNORMAL LOW (ref 150–440)
RBC: 3.12 10*6/uL — ABNORMAL LOW (ref 3.80–5.20)
RDW: 20.3 % — AB (ref 11.5–14.5)
WBC: 3.2 10*3/uL — AB (ref 3.6–11.0)

## 2013-11-16 LAB — HEPATIC FUNCTION PANEL A (ARMC)
ALK PHOS: 350 U/L — AB
Albumin: 1.3 g/dL — ABNORMAL LOW (ref 3.4–5.0)
Bilirubin, Direct: 0.5 mg/dL — ABNORMAL HIGH (ref 0.00–0.20)
Bilirubin,Total: 0.9 mg/dL (ref 0.2–1.0)
SGOT(AST): 44 U/L — ABNORMAL HIGH (ref 15–37)
SGPT (ALT): 13 U/L (ref 12–78)
TOTAL PROTEIN: 5.2 g/dL — AB (ref 6.4–8.2)

## 2013-11-16 LAB — BASIC METABOLIC PANEL
ANION GAP: 6 — AB (ref 7–16)
BUN: 16 mg/dL (ref 7–18)
CALCIUM: 6.5 mg/dL — AB (ref 8.5–10.1)
CHLORIDE: 92 mmol/L — AB (ref 98–107)
Co2: 31 mmol/L (ref 21–32)
Creatinine: 3.23 mg/dL — ABNORMAL HIGH (ref 0.60–1.30)
EGFR (African American): 19 — ABNORMAL LOW
EGFR (Non-African Amer.): 16 — ABNORMAL LOW
GLUCOSE: 83 mg/dL (ref 65–99)
OSMOLALITY: 259 (ref 275–301)
Potassium: 3.2 mmol/L — ABNORMAL LOW (ref 3.5–5.1)
Sodium: 129 mmol/L — ABNORMAL LOW (ref 136–145)

## 2013-11-16 LAB — MAGNESIUM: Magnesium: 1.5 mg/dL — ABNORMAL LOW

## 2013-11-16 LAB — PATHOLOGY REPORT

## 2013-11-17 LAB — CBC WITH DIFFERENTIAL/PLATELET
Basophil #: 0 10*3/uL (ref 0.0–0.1)
Basophil %: 0.7 %
Eosinophil #: 0 10*3/uL (ref 0.0–0.7)
Eosinophil %: 0.1 %
HCT: 27.9 % — AB (ref 35.0–47.0)
HGB: 8.5 g/dL — ABNORMAL LOW (ref 12.0–16.0)
LYMPHS PCT: 10.3 %
Lymphocyte #: 0.3 10*3/uL — ABNORMAL LOW (ref 1.0–3.6)
MCH: 26.3 pg (ref 26.0–34.0)
MCHC: 30.5 g/dL — ABNORMAL LOW (ref 32.0–36.0)
MCV: 86 fL (ref 80–100)
Monocyte #: 0.3 x10 3/mm (ref 0.2–0.9)
Monocyte %: 8.9 %
Neutrophil #: 2.3 10*3/uL (ref 1.4–6.5)
Neutrophil %: 80 %
Platelet: 79 10*3/uL — ABNORMAL LOW (ref 150–440)
RBC: 3.23 10*6/uL — ABNORMAL LOW (ref 3.80–5.20)
RDW: 19.5 % — AB (ref 11.5–14.5)
WBC: 2.9 10*3/uL — AB (ref 3.6–11.0)

## 2013-11-17 LAB — RENAL FUNCTION PANEL
ANION GAP: 10 (ref 7–16)
Albumin: 1.3 g/dL — ABNORMAL LOW (ref 3.4–5.0)
BUN: 19 mg/dL — AB (ref 7–18)
CALCIUM: 6.4 mg/dL — AB (ref 8.5–10.1)
CHLORIDE: 93 mmol/L — AB (ref 98–107)
CO2: 27 mmol/L (ref 21–32)
Creatinine: 4.03 mg/dL — ABNORMAL HIGH (ref 0.60–1.30)
EGFR (African American): 14 — ABNORMAL LOW
GFR CALC NON AF AMER: 12 — AB
GLUCOSE: 86 mg/dL (ref 65–99)
Osmolality: 262 (ref 275–301)
Phosphorus: 2.4 mg/dL — ABNORMAL LOW (ref 2.5–4.9)
Potassium: 3.9 mmol/L (ref 3.5–5.1)
Sodium: 130 mmol/L — ABNORMAL LOW (ref 136–145)

## 2013-11-17 LAB — BASIC METABOLIC PANEL
Anion Gap: 7 (ref 7–16)
BUN: 20 mg/dL — ABNORMAL HIGH (ref 7–18)
CALCIUM: 6.3 mg/dL — AB (ref 8.5–10.1)
CREATININE: 3.83 mg/dL — AB (ref 0.60–1.30)
Chloride: 93 mmol/L — ABNORMAL LOW (ref 98–107)
Co2: 29 mmol/L (ref 21–32)
EGFR (African American): 15 — ABNORMAL LOW
GFR CALC NON AF AMER: 13 — AB
Glucose: 59 mg/dL — ABNORMAL LOW (ref 65–99)
Osmolality: 259 (ref 275–301)
Potassium: 3.9 mmol/L (ref 3.5–5.1)
Sodium: 129 mmol/L — ABNORMAL LOW (ref 136–145)

## 2013-11-17 LAB — MAGNESIUM: Magnesium: 2 mg/dL

## 2013-11-18 LAB — CULTURE, BLOOD (SINGLE)

## 2013-11-19 LAB — BASIC METABOLIC PANEL
Anion Gap: 7 (ref 7–16)
Anion Gap: 8 (ref 7–16)
BUN: 19 mg/dL — ABNORMAL HIGH (ref 7–18)
BUN: 21 mg/dL — ABNORMAL HIGH (ref 7–18)
CHLORIDE: 98 mmol/L (ref 98–107)
Calcium, Total: 6.5 mg/dL — CL (ref 8.5–10.1)
Calcium, Total: 6.3 mg/dL — CL (ref 8.5–10.1)
Chloride: 98 mmol/L (ref 98–107)
Co2: 28 mmol/L (ref 21–32)
Co2: 28 mmol/L (ref 21–32)
Creatinine: 3.74 mg/dL — ABNORMAL HIGH (ref 0.60–1.30)
Creatinine: 3.97 mg/dL — ABNORMAL HIGH (ref 0.60–1.30)
EGFR (African American): 16 — ABNORMAL LOW
EGFR (African American): 14 — ABNORMAL LOW
EGFR (Non-African Amer.): 13 — ABNORMAL LOW
EGFR (Non-African Amer.): 12 — ABNORMAL LOW
GLUCOSE: 142 mg/dL — AB (ref 65–99)
Glucose: 73 mg/dL (ref 65–99)
OSMOLALITY: 267 (ref 275–301)
OSMOLALITY: 274 (ref 275–301)
Potassium: 3.5 mmol/L (ref 3.5–5.1)
Potassium: 3.4 mmol/L — ABNORMAL LOW (ref 3.5–5.1)
SODIUM: 134 mmol/L — AB (ref 136–145)
Sodium: 133 mmol/L — ABNORMAL LOW (ref 136–145)

## 2013-11-19 LAB — CBC WITH DIFFERENTIAL/PLATELET
BASOS ABS: 0 10*3/uL (ref 0.0–0.1)
BASOS PCT: 0.7 %
EOS ABS: 0 10*3/uL (ref 0.0–0.7)
Eosinophil %: 0 %
HCT: 23.9 % — AB (ref 35.0–47.0)
HGB: 7.6 g/dL — ABNORMAL LOW (ref 12.0–16.0)
Lymphocyte #: 0.4 10*3/uL — ABNORMAL LOW (ref 1.0–3.6)
Lymphocyte %: 13.5 %
MCH: 27.4 pg (ref 26.0–34.0)
MCHC: 32 g/dL (ref 32.0–36.0)
MCV: 86 fL (ref 80–100)
Monocyte #: 0.4 x10 3/mm (ref 0.2–0.9)
Monocyte %: 11 %
NEUTROS ABS: 2.5 10*3/uL (ref 1.4–6.5)
Neutrophil %: 74.8 %
Platelet: 93 10*3/uL — ABNORMAL LOW (ref 150–440)
RBC: 2.79 10*6/uL — AB (ref 3.80–5.20)
RDW: 19.3 % — ABNORMAL HIGH (ref 11.5–14.5)
WBC: 3.3 10*3/uL — ABNORMAL LOW (ref 3.6–11.0)

## 2013-11-20 LAB — BASIC METABOLIC PANEL
Anion Gap: 8 (ref 7–16)
BUN: 12 mg/dL (ref 7–18)
CO2: 30 mmol/L (ref 21–32)
CREATININE: 2.91 mg/dL — AB (ref 0.60–1.30)
Calcium, Total: 6.5 mg/dL — CL (ref 8.5–10.1)
Chloride: 104 mmol/L (ref 98–107)
EGFR (Non-African Amer.): 18 — ABNORMAL LOW
GFR CALC AF AMER: 21 — AB
Glucose: 88 mg/dL (ref 65–99)
Osmolality: 282 (ref 275–301)
Potassium: 3.3 mmol/L — ABNORMAL LOW (ref 3.5–5.1)
Sodium: 142 mmol/L (ref 136–145)

## 2013-11-20 LAB — HEMOGLOBIN: HGB: 6.7 g/dL — AB (ref 12.0–16.0)

## 2013-11-21 LAB — HEMOGLOBIN: HGB: 8 g/dL — ABNORMAL LOW (ref 12.0–16.0)

## 2013-11-29 ENCOUNTER — Inpatient Hospital Stay: Payer: Self-pay | Admitting: Internal Medicine

## 2013-11-29 LAB — CBC WITH DIFFERENTIAL/PLATELET
Bands: 7 %
HCT: 14.3 % — AB (ref 35.0–47.0)
HGB: 4.5 g/dL — AB (ref 12.0–16.0)
Lymphocytes: 18 %
MCH: 27.5 pg (ref 26.0–34.0)
MCHC: 31.5 g/dL — AB (ref 32.0–36.0)
MCV: 88 fL (ref 80–100)
MONOS PCT: 2 %
Platelet: 82 10*3/uL — ABNORMAL LOW (ref 150–440)
RBC: 1.63 10*6/uL — AB (ref 3.80–5.20)
RDW: 19.5 % — AB (ref 11.5–14.5)
Segmented Neutrophils: 73 %
WBC: 3 10*3/uL — ABNORMAL LOW (ref 3.6–11.0)

## 2013-11-29 LAB — COMPREHENSIVE METABOLIC PANEL
ALBUMIN: 1 g/dL — AB (ref 3.4–5.0)
ALK PHOS: 302 U/L — AB
Anion Gap: 9 (ref 7–16)
BUN: 12 mg/dL (ref 7–18)
Bilirubin,Total: 0.9 mg/dL (ref 0.2–1.0)
CALCIUM: 6.2 mg/dL — AB (ref 8.5–10.1)
CO2: 27 mmol/L (ref 21–32)
Chloride: 107 mmol/L (ref 98–107)
Creatinine: 2.69 mg/dL — ABNORMAL HIGH (ref 0.60–1.30)
EGFR (African American): 23 — ABNORMAL LOW
EGFR (Non-African Amer.): 20 — ABNORMAL LOW
GLUCOSE: 78 mg/dL (ref 65–99)
Osmolality: 284 (ref 275–301)
Potassium: 3.1 mmol/L — ABNORMAL LOW (ref 3.5–5.1)
SGOT(AST): 44 U/L — ABNORMAL HIGH (ref 15–37)
SGPT (ALT): 20 U/L (ref 12–78)
Sodium: 143 mmol/L (ref 136–145)
Total Protein: 4.8 g/dL — ABNORMAL LOW (ref 6.4–8.2)

## 2013-11-29 LAB — CK TOTAL AND CKMB (NOT AT ARMC)
CK, Total: 73 U/L
CK, Total: 67 U/L
CK-MB: 2 ng/mL (ref 0.5–3.6)
CK-MB: 1.9 ng/mL (ref 0.5–3.6)

## 2013-11-29 LAB — TROPONIN I
TROPONIN-I: 0.07 ng/mL — AB
Troponin-I: 0.07 ng/mL — ABNORMAL HIGH

## 2013-11-30 LAB — BASIC METABOLIC PANEL
Anion Gap: 6 — ABNORMAL LOW (ref 7–16)
BUN: 14 mg/dL (ref 7–18)
CO2: 29 mmol/L (ref 21–32)
CREATININE: 2.93 mg/dL — AB (ref 0.60–1.30)
Calcium, Total: 6.4 mg/dL — CL (ref 8.5–10.1)
Chloride: 108 mmol/L — ABNORMAL HIGH (ref 98–107)
EGFR (African American): 21 — ABNORMAL LOW
EGFR (Non-African Amer.): 18 — ABNORMAL LOW
Glucose: 74 mg/dL (ref 65–99)
OSMOLALITY: 284 (ref 275–301)
Potassium: 3.1 mmol/L — ABNORMAL LOW (ref 3.5–5.1)
SODIUM: 143 mmol/L (ref 136–145)

## 2013-11-30 LAB — CBC WITH DIFFERENTIAL/PLATELET
Bands: 3 %
HCT: 21.9 % — AB (ref 35.0–47.0)
HGB: 7 g/dL — ABNORMAL LOW (ref 12.0–16.0)
LYMPHS PCT: 9 %
MCH: 27.4 pg (ref 26.0–34.0)
MCHC: 32.1 g/dL (ref 32.0–36.0)
MCV: 85 fL (ref 80–100)
Monocytes: 8 %
Platelet: 71 10*3/uL — ABNORMAL LOW (ref 150–440)
RBC: 2.57 10*6/uL — AB (ref 3.80–5.20)
RDW: 16.4 % — ABNORMAL HIGH (ref 11.5–14.5)
Segmented Neutrophils: 80 %
WBC: 3.2 10*3/uL — ABNORMAL LOW (ref 3.6–11.0)

## 2013-11-30 LAB — IRON AND TIBC
IRON: 53 ug/dL (ref 50–170)
Iron Bind.Cap.(Total): 22 ug/dL — ABNORMAL LOW (ref 250–450)

## 2013-11-30 LAB — FERRITIN: FERRITIN (ARMC): 454 ng/mL — AB (ref 8–388)

## 2013-11-30 LAB — CK TOTAL AND CKMB (NOT AT ARMC)
CK, TOTAL: 64 U/L
CK-MB: 2 ng/mL (ref 0.5–3.6)

## 2013-11-30 LAB — MRSA PCR SCREENING

## 2013-11-30 LAB — TROPONIN I: Troponin-I: 0.08 ng/mL — ABNORMAL HIGH

## 2013-11-30 LAB — OCCULT BLOOD X 1 CARD TO LAB, STOOL: Occult Blood, Feces: NEGATIVE

## 2013-12-01 LAB — CLOSTRIDIUM DIFFICILE(ARMC)

## 2013-12-01 LAB — CBC WITH DIFFERENTIAL/PLATELET
Bands: 1 %
Basophil: 0 %
EOS PCT: 0 %
HCT: 17.6 % — ABNORMAL LOW (ref 35.0–47.0)
HGB: 5.7 g/dL — ABNORMAL LOW (ref 12.0–16.0)
Lymphocytes: 12 %
MCH: 27.9 pg (ref 26.0–34.0)
MCHC: 32.3 g/dL (ref 32.0–36.0)
MCV: 87 fL (ref 80–100)
Metamyelocyte: 4 %
Monocytes: 4 %
NRBC/100 WBC: 2 /
PLATELETS: 68 10*3/uL — AB (ref 150–440)
RBC: 2.04 10*6/uL — AB (ref 3.80–5.20)
RDW: 17.4 % — AB (ref 11.5–14.5)
Segmented Neutrophils: 79 %
WBC: 2.4 10*3/uL — ABNORMAL LOW (ref 3.6–11.0)

## 2013-12-01 LAB — PHOSPHORUS: PHOSPHORUS: 2.1 mg/dL — AB (ref 2.5–4.9)

## 2013-12-01 LAB — BASIC METABOLIC PANEL
Anion Gap: 8 (ref 7–16)
BUN: 20 mg/dL — ABNORMAL HIGH (ref 7–18)
CALCIUM: 6.2 mg/dL — AB (ref 8.5–10.1)
Chloride: 104 mmol/L (ref 98–107)
Co2: 27 mmol/L (ref 21–32)
Creatinine: 3.52 mg/dL — ABNORMAL HIGH (ref 0.60–1.30)
EGFR (African American): 17 — ABNORMAL LOW
GFR CALC NON AF AMER: 14 — AB
Glucose: 260 mg/dL — ABNORMAL HIGH (ref 65–99)
Osmolality: 289 (ref 275–301)
POTASSIUM: 3.1 mmol/L — AB (ref 3.5–5.1)
SODIUM: 139 mmol/L (ref 136–145)

## 2013-12-01 LAB — RETICULOCYTES
Absolute Retic Count: 0.0497 10*6/uL (ref 0.019–0.186)
Reticulocyte: 1.72 % (ref 0.4–3.1)

## 2013-12-02 LAB — CBC WITH DIFFERENTIAL/PLATELET
BANDS NEUTROPHIL: 20 %
HCT: 21.1 % — AB (ref 35.0–47.0)
HGB: 7 g/dL — ABNORMAL LOW (ref 12.0–16.0)
Lymphocytes: 8 %
MCH: 28.4 pg (ref 26.0–34.0)
MCHC: 33 g/dL (ref 32.0–36.0)
MCV: 86 fL (ref 80–100)
METAMYELOCYTE: 3 %
MYELOCYTE: 2 %
Monocytes: 2 %
Platelet: 70 10*3/uL — ABNORMAL LOW (ref 150–440)
RBC: 2.45 10*6/uL — AB (ref 3.80–5.20)
RDW: 16.8 % — ABNORMAL HIGH (ref 11.5–14.5)
SEGMENTED NEUTROPHILS: 65 %
WBC: 4.1 10*3/uL (ref 3.6–11.0)

## 2013-12-02 LAB — BASIC METABOLIC PANEL
ANION GAP: 7 (ref 7–16)
BUN: 12 mg/dL (ref 7–18)
CHLORIDE: 103 mmol/L (ref 98–107)
CREATININE: 2.9 mg/dL — AB (ref 0.60–1.30)
Calcium, Total: 7.1 mg/dL — ABNORMAL LOW (ref 8.5–10.1)
Co2: 30 mmol/L (ref 21–32)
EGFR (African American): 21 — ABNORMAL LOW
EGFR (Non-African Amer.): 18 — ABNORMAL LOW
Glucose: 85 mg/dL (ref 65–99)
Osmolality: 278 (ref 275–301)
POTASSIUM: 3.2 mmol/L — AB (ref 3.5–5.1)
Sodium: 140 mmol/L (ref 136–145)

## 2013-12-03 LAB — CBC WITH DIFFERENTIAL/PLATELET
Bands: 13 %
HCT: 23 % — ABNORMAL LOW (ref 35.0–47.0)
HGB: 7.5 g/dL — ABNORMAL LOW (ref 12.0–16.0)
Lymphocytes: 12 %
MCH: 28.2 pg (ref 26.0–34.0)
MCHC: 32.8 g/dL (ref 32.0–36.0)
MCV: 86 fL (ref 80–100)
Metamyelocyte: 1 %
Monocytes: 3 %
Myelocyte: 2 %
NRBC/100 WBC: 1 /
PLATELETS: 73 10*3/uL — AB (ref 150–440)
RBC: 2.67 10*6/uL — ABNORMAL LOW (ref 3.80–5.20)
RDW: 17.8 % — ABNORMAL HIGH (ref 11.5–14.5)
SEGMENTED NEUTROPHILS: 69 %
WBC: 3.2 10*3/uL — ABNORMAL LOW (ref 3.6–11.0)

## 2013-12-03 LAB — POTASSIUM: POTASSIUM: 3.8 mmol/L (ref 3.5–5.1)

## 2013-12-03 LAB — STOOL CULTURE

## 2013-12-03 LAB — MAGNESIUM: MAGNESIUM: 1.4 mg/dL — AB

## 2013-12-03 LAB — PHOSPHORUS: Phosphorus: 1.4 mg/dL — ABNORMAL LOW (ref 2.5–4.9)

## 2013-12-04 LAB — CBC WITH DIFFERENTIAL/PLATELET
BANDS NEUTROPHIL: 6 %
HCT: 23.7 % — ABNORMAL LOW (ref 35.0–47.0)
HGB: 7.7 g/dL — ABNORMAL LOW (ref 12.0–16.0)
LYMPHS PCT: 11 %
MCH: 28.1 pg (ref 26.0–34.0)
MCHC: 32.3 g/dL (ref 32.0–36.0)
MCV: 87 fL (ref 80–100)
METAMYELOCYTE: 1 %
MYELOCYTE: 3 %
Monocytes: 5 %
PLATELETS: 52 10*3/uL — AB (ref 150–440)
RBC: 2.72 10*6/uL — ABNORMAL LOW (ref 3.80–5.20)
RDW: 17.8 % — ABNORMAL HIGH (ref 11.5–14.5)
SEGMENTED NEUTROPHILS: 74 %
WBC: 3.3 10*3/uL — ABNORMAL LOW (ref 3.6–11.0)

## 2013-12-04 LAB — CULTURE, BLOOD (SINGLE)

## 2013-12-04 LAB — MAGNESIUM: Magnesium: 1.6 mg/dL — ABNORMAL LOW

## 2013-12-04 LAB — POTASSIUM: Potassium: 3.7 mmol/L (ref 3.5–5.1)

## 2013-12-05 LAB — CBC WITH DIFFERENTIAL/PLATELET
BANDS NEUTROPHIL: 8 %
HCT: 21.9 % — ABNORMAL LOW (ref 35.0–47.0)
HGB: 7 g/dL — ABNORMAL LOW (ref 12.0–16.0)
Lymphocytes: 4 %
MCH: 28.1 pg (ref 26.0–34.0)
MCHC: 31.8 g/dL — AB (ref 32.0–36.0)
MCV: 88 fL (ref 80–100)
Metamyelocyte: 1 %
Monocytes: 3 %
Myelocyte: 1 %
Platelet: 40 10*3/uL — ABNORMAL LOW (ref 150–440)
RBC: 2.49 10*6/uL — AB (ref 3.80–5.20)
RDW: 18.6 % — AB (ref 11.5–14.5)
SEGMENTED NEUTROPHILS: 83 %
WBC: 3.5 10*3/uL — AB (ref 3.6–11.0)

## 2013-12-05 LAB — BASIC METABOLIC PANEL
ANION GAP: 8 (ref 7–16)
BUN: 19 mg/dL — AB (ref 7–18)
CHLORIDE: 94 mmol/L — AB (ref 98–107)
CO2: 26 mmol/L (ref 21–32)
Calcium, Total: 6.6 mg/dL — CL (ref 8.5–10.1)
Creatinine: 4.1 mg/dL — ABNORMAL HIGH (ref 0.60–1.30)
EGFR (Non-African Amer.): 12 — ABNORMAL LOW
GFR CALC AF AMER: 14 — AB
GLUCOSE: 95 mg/dL (ref 65–99)
OSMOLALITY: 259 (ref 275–301)
POTASSIUM: 3.6 mmol/L (ref 3.5–5.1)
SODIUM: 128 mmol/L — AB (ref 136–145)

## 2013-12-05 LAB — MAGNESIUM: Magnesium: 2 mg/dL

## 2013-12-06 LAB — CBC WITH DIFFERENTIAL/PLATELET
Bands: 1 %
HCT: 23.8 % — AB (ref 35.0–47.0)
HGB: 7.9 g/dL — ABNORMAL LOW (ref 12.0–16.0)
Lymphocytes: 6 %
MCH: 28.6 pg (ref 26.0–34.0)
MCHC: 33 g/dL (ref 32.0–36.0)
MCV: 87 fL (ref 80–100)
Monocytes: 8 %
PLATELETS: 40 10*3/uL — AB (ref 150–440)
RBC: 2.75 10*6/uL — ABNORMAL LOW (ref 3.80–5.20)
RDW: 18.5 % — AB (ref 11.5–14.5)
SEGMENTED NEUTROPHILS: 85 %
WBC: 3.1 10*3/uL — ABNORMAL LOW (ref 3.6–11.0)

## 2013-12-06 LAB — MAGNESIUM: Magnesium: 1.8 mg/dL

## 2013-12-06 LAB — POTASSIUM: POTASSIUM: 3.8 mmol/L (ref 3.5–5.1)

## 2013-12-06 LAB — PHOSPHORUS: Phosphorus: 2.3 mg/dL — ABNORMAL LOW (ref 2.5–4.9)

## 2013-12-07 LAB — BASIC METABOLIC PANEL
Anion Gap: 11 (ref 7–16)
BUN: 15 mg/dL (ref 7–18)
CHLORIDE: 94 mmol/L — AB (ref 98–107)
Calcium, Total: 6.6 mg/dL — CL (ref 8.5–10.1)
Co2: 26 mmol/L (ref 21–32)
Creatinine: 3.3 mg/dL — ABNORMAL HIGH (ref 0.60–1.30)
EGFR (Non-African Amer.): 16 — ABNORMAL LOW
GFR CALC AF AMER: 18 — AB
GLUCOSE: 69 mg/dL (ref 65–99)
Osmolality: 262 (ref 275–301)
Potassium: 3.4 mmol/L — ABNORMAL LOW (ref 3.5–5.1)
SODIUM: 131 mmol/L — AB (ref 136–145)

## 2013-12-07 LAB — CBC WITH DIFFERENTIAL/PLATELET
Bands: 5 %
HCT: 21.9 % — ABNORMAL LOW (ref 35.0–47.0)
HGB: 7.4 g/dL — ABNORMAL LOW (ref 12.0–16.0)
Lymphocytes: 6 %
MCH: 29.2 pg (ref 26.0–34.0)
MCHC: 33.9 g/dL (ref 32.0–36.0)
MCV: 86 fL (ref 80–100)
MONOS PCT: 5 %
OTHER CELLS BLOOD: 1
Platelet: 36 10*3/uL — ABNORMAL LOW (ref 150–440)
RBC: 2.55 10*6/uL — AB (ref 3.80–5.20)
RDW: 18.8 % — ABNORMAL HIGH (ref 11.5–14.5)
SEGMENTED NEUTROPHILS: 83 %
WBC: 2.7 10*3/uL — ABNORMAL LOW (ref 3.6–11.0)

## 2013-12-07 LAB — MAGNESIUM: Magnesium: 1.7 mg/dL — ABNORMAL LOW

## 2013-12-08 LAB — CBC WITH DIFFERENTIAL/PLATELET
Bands: 7 %
Basophil #: 0 10*3/uL (ref 0.0–0.1)
Basophil %: 0.3 %
Comment - H1-Com4: NORMAL
EOS PCT: 0 %
Eosinophil #: 0 10*3/uL (ref 0.0–0.7)
HCT: 28.4 % — AB (ref 35.0–47.0)
HCT: 23.2 % — AB (ref 35.0–47.0)
HGB: 9.2 g/dL — ABNORMAL LOW (ref 12.0–16.0)
HGB: 7.6 g/dL — ABNORMAL LOW (ref 12.0–16.0)
Lymphocyte #: 0.2 10*3/uL — ABNORMAL LOW (ref 1.0–3.6)
Lymphocyte %: 7.2 %
Lymphocytes: 5 %
MCH: 28 pg (ref 26.0–34.0)
MCH: 28.4 pg (ref 26.0–34.0)
MCHC: 32.3 g/dL (ref 32.0–36.0)
MCHC: 32.9 g/dL (ref 32.0–36.0)
MCV: 87 fL (ref 80–100)
MCV: 86 fL (ref 80–100)
MONO ABS: 0.1 x10 3/mm — AB (ref 0.2–0.9)
Metamyelocyte: 3 %
Monocyte %: 2.6 %
Monocytes: 4 %
NEUTROS ABS: 2.2 10*3/uL (ref 1.4–6.5)
Neutrophil %: 89.9 %
Platelet: 58 10*3/uL — ABNORMAL LOW (ref 150–440)
Platelet: 47 10*3/uL — ABNORMAL LOW (ref 150–440)
RBC: 3.28 10*6/uL — AB (ref 3.80–5.20)
RBC: 2.69 10*6/uL — ABNORMAL LOW (ref 3.80–5.20)
RDW: 19.4 % — ABNORMAL HIGH (ref 11.5–14.5)
RDW: 18.7 % — ABNORMAL HIGH (ref 11.5–14.5)
Segmented Neutrophils: 81 %
WBC: 2.5 10*3/uL — ABNORMAL LOW (ref 3.6–11.0)
WBC: 2.1 10*3/uL — AB (ref 3.6–11.0)

## 2013-12-08 LAB — RENAL FUNCTION PANEL
Albumin: 0.9 g/dL — ABNORMAL LOW (ref 3.4–5.0)
Anion Gap: 12 (ref 7–16)
BUN: 16 mg/dL (ref 7–18)
CHLORIDE: 90 mmol/L — AB (ref 98–107)
CREATININE: 3.68 mg/dL — AB (ref 0.60–1.30)
Calcium, Total: 6.9 mg/dL — CL (ref 8.5–10.1)
Co2: 22 mmol/L (ref 21–32)
GFR CALC AF AMER: 16 — AB
GFR CALC NON AF AMER: 14 — AB
GLUCOSE: 126 mg/dL — AB (ref 65–99)
Osmolality: 252 (ref 275–301)
Phosphorus: 2.4 mg/dL — ABNORMAL LOW (ref 2.5–4.9)
Potassium: 3.8 mmol/L (ref 3.5–5.1)
Sodium: 124 mmol/L — ABNORMAL LOW (ref 136–145)

## 2013-12-08 LAB — MAGNESIUM: Magnesium: 2 mg/dL

## 2013-12-08 LAB — POTASSIUM: POTASSIUM: 3.8 mmol/L (ref 3.5–5.1)

## 2013-12-09 ENCOUNTER — Emergency Department: Payer: Self-pay | Admitting: Emergency Medicine

## 2013-12-09 LAB — COMPREHENSIVE METABOLIC PANEL
ALBUMIN: 1.2 g/dL — AB (ref 3.4–5.0)
Alkaline Phosphatase: 127 U/L — ABNORMAL HIGH
Anion Gap: 9 (ref 7–16)
BUN: 12 mg/dL (ref 7–18)
Bilirubin,Total: 13.8 mg/dL — ABNORMAL HIGH (ref 0.2–1.0)
CO2: 25 mmol/L (ref 21–32)
Calcium, Total: 7.1 mg/dL — ABNORMAL LOW (ref 8.5–10.1)
Chloride: 94 mmol/L — ABNORMAL LOW (ref 98–107)
Creatinine: 2.85 mg/dL — ABNORMAL HIGH (ref 0.60–1.30)
EGFR (African American): 22 — ABNORMAL LOW
EGFR (Non-African Amer.): 19 — ABNORMAL LOW
GLUCOSE: 61 mg/dL — AB (ref 65–99)
OSMOLALITY: 255 (ref 275–301)
Potassium: 4.5 mmol/L (ref 3.5–5.1)
SGOT(AST): 95 U/L — ABNORMAL HIGH (ref 15–37)
SGPT (ALT): 18 U/L
Sodium: 128 mmol/L — ABNORMAL LOW (ref 136–145)
TOTAL PROTEIN: 5 g/dL — AB (ref 6.4–8.2)

## 2013-12-09 LAB — PROTIME-INR
INR: 1.6
Prothrombin Time: 18.6 secs — ABNORMAL HIGH (ref 11.5–14.7)

## 2013-12-09 LAB — BILIRUBIN, DIRECT: BILIRUBIN DIRECT: 8.6 mg/dL — AB (ref 0.00–0.20)

## 2013-12-09 LAB — CBC
HCT: 32.8 % — ABNORMAL LOW (ref 35.0–47.0)
HGB: 10.6 g/dL — AB (ref 12.0–16.0)
MCH: 28.3 pg (ref 26.0–34.0)
MCHC: 32.3 g/dL (ref 32.0–36.0)
MCV: 88 fL (ref 80–100)
PLATELETS: 61 10*3/uL — AB (ref 150–440)
RBC: 3.74 10*6/uL — ABNORMAL LOW (ref 3.80–5.20)
RDW: 17.9 % — ABNORMAL HIGH (ref 11.5–14.5)
WBC: 3.4 10*3/uL — ABNORMAL LOW (ref 3.6–11.0)

## 2013-12-09 LAB — ACETAMINOPHEN LEVEL: Acetaminophen: 8 ug/mL — ABNORMAL LOW

## 2013-12-09 LAB — LIPASE, BLOOD: LIPASE: 260 U/L (ref 73–393)

## 2013-12-09 LAB — APTT: Activated PTT: 66.6 secs — ABNORMAL HIGH (ref 23.6–35.9)

## 2013-12-09 LAB — TROPONIN I: Troponin-I: 0.06 ng/mL — ABNORMAL HIGH

## 2013-12-10 ENCOUNTER — Ambulatory Visit: Payer: Self-pay | Admitting: Internal Medicine

## 2013-12-14 LAB — CULTURE, BLOOD (SINGLE)

## 2014-01-10 DEATH — deceased

## 2014-09-02 NOTE — Consult Note (Signed)
Chief Complaint:  Subjective/Chief Complaint seen for anemia.  continues with nausea, no emesis, continues with diarrhea today.  mild periumbilical abdominal discomfort.   VITAL SIGNS/ANCILLARY NOTES: **Vital Signs.:   23-Jul-15 14:14  Vital Signs Type Post Dialysis  Temperature Temperature (F) 98.3  Celsius 36.8  Pulse Pulse 95  Respirations Respirations 18  Systolic BP Systolic BP 867  Diastolic BP (mmHg) Diastolic BP (mmHg) 87  Mean BP 106  Pulse Ox Activity Level  At rest  Oxygen Delivery 2L  *Intake and Output.:   23-Jul-15 06:55  Stool  loose stool x1    14:54  Stool  large, very loose stool   Brief Assessment:  Cardiac Regular   Respiratory clear BS   Gastrointestinal details normal Soft  Nondistended  Bowel sounds normal  No rebound tenderness  minimal periumbilical tenderness.   Lab Results: Routine BB:  23-Jul-15 10:51   Direct Coombs, Polyspecific Negative (Result(s) reported on 01 Dec 2013 at 01:30PM.)  Routine Micro:  23-Jul-15 06:45   Micro Text Report CLOSTRIDIUM DIFFICILE   C.DIFFICILE ANTIGEN       C.DIFFICILE GDH ANTIGEN : NEGATIVE   C.DIFFICILE TOXIN A/B     C.DIFFICILE TOXINS A AND B : NEGATIVE   INTERPRETATION            Negative for C. difficile.    ANTIBIOTIC                        Routine Chem:  23-Jul-15 04:38   Glucose, Serum  260  BUN  20  Creatinine (comp)  3.52  Sodium, Serum 139  Potassium, Serum  3.1  Chloride, Serum 104  CO2, Serum 27  Calcium (Total), Serum  6.2  Anion Gap 8  Osmolality (calc) 289  eGFR (African American)  17  eGFR (Non-African American)  14 (eGFR values <53m/min/1.73 m2 may be an indication of chronic kidney disease (CKD). Calculated eGFR is useful in patients with stable renal function. The eGFR calculation will not be reliable in acutely ill patients when serum creatinine is changing rapidly. It is not useful in  patients on dialysis. The eGFR calculation may not be applicable to patients at the low  and high extremes of body sizes, pregnant women, and vegetarians.)  Result Comment LABS - This specimen was collected through an   - indwelling catheter or arterial line.  - A minimum of 566m of blood was wasted prior    - to collecting the sample.  Interpret  - results with caution. CALCIUM - RESULTS VERIFIED BY REPEAT TESTING.  - NOTIFIED OF CRITICAL VALUE  - C/ CRYSTAL BEASLEY @0522  12-01-13..AMarland KitchenO  - READ-BACK PROCESS PERFORMED.  Result(s) reported on 01 Dec 2013 at 05:22AM.    10:51   Phosphorus, Serum  2.1 (Result(s) reported on 01 Dec 2013 at 11:35AM.)  Routine Sero:  22-Jul-15 18:12   Occult Blood, Feces NEGATIVE (Result(s) reported on 30 Nov 2013 at 06:43PM.)  Routine Hem:  23-Jul-15 04:38   WBC (CBC)  2.4  RBC (CBC)  2.04  Hemoglobin (CBC)  5.7  Hematocrit (CBC)  17.6  Platelet Count (CBC)  68 (Result(s) reported on 01 Dec 2013 at 07:32AM.)  MCV 87  MCH 27.9  MCHC 32.3  RDW  17.4  Bands 1  Segmented Neutrophils 79  Lymphocytes 12  Monocytes 4  Eosinophil 0  Basophil 0  Metamyelocyte 4  NRBC 2  Diff Comment 1 HYPOCHROMIA  Diff Comment 2 ANISOCYTOSIS  Diff Comment 3  POIKILOCYTOSIS  Diff Comment 4 PLTS VARIED IN SIZE  Result(s) reported on 01 Dec 2013 at 07:32AM.    10:51   Retic Count 1.72  Absolute Retic Count 0.0497 (Result(s) reported on 01 Dec 2013 at 01:12PM.)   Assessment/Plan:  Assessment/Plan:  Assessment 1) anemia-heme negative.  Previous egd showing esophageal ulcers, serology positive for CMV, but bx negative.  2) supression of all 3 blood lines, possibl MAI. 3) HIV-cd4 14. ID following.   Plan 1) continue ppi, no plans for furhter luminal evaluation at this point.  2) awaiting stool study results.   Electronic Signatures: Loistine Simas (MD)  (Signed 23-Jul-15 17:04)  Authored: Chief Complaint, VITAL SIGNS/ANCILLARY NOTES, Brief Assessment, Lab Results, Assessment/Plan   Last Updated: 23-Jul-15 17:04 by Loistine Simas (MD)

## 2014-09-02 NOTE — Consult Note (Signed)
Pt seen and examined. Full consult to follow. Pt admittted with unstable angina. Ruled in for MI. On heparin drip. Pt with known hx of NSAID-induced ulcers 2 years ago. Apparently, had normal colonoscopy around the same time. Intermittent odynophagia to solids x 1-2 weeks2 months, but denies dysphagia or heartburn. Intermittent nausea, vomiting, and diarrhea ever since patient started on dialysis. Pt now getting dialysis. Feels cold. Had abnormal stress test. For cardiac cath. Agree with poIV protonix bid. Agree with heparin as long as patient not actively bleeding. Due to pt's current cardiac status, no immediate plans for EGD. Consider UGI series IF symptoms of odynophagia persists while patient in hospital. Will follow. Thanks.   Electronic Signatures: Lutricia Feilh, Yulissa Needham (MD) (Signed on 07-May-15 14:26)  Authored   Last Updated: 07-May-15 14:30 by Lutricia Feilh, Brigham Cobbins (MD)

## 2014-09-02 NOTE — H&P (Signed)
PATIENT NAME:  Veronica Gordon, Veronica Gordon MR#:  [id number] DATE OF BIRTH:  06-29-1965  DATE OF ADMISSION:  09/15/2013  PRIMARY CARE PHYSICIAN: Dr. Maryellen PileEason   HISTORY OF PRESENT ILLNESS: The patient is a 49 year old African American female with history of endstage renal disease with hemodialysis Tuesday, Thursday, and Saturday who presented to the hospital with complaints of significant chest pain. According to the patient, she was doing well up until approximately a week ago when she started having chest pain which she describes as heartburn. She tells me that she needs to eat very slow as when she tries to eat quickly, when food travels down the throat, it burns and it is very uncomfortable, very painful for her. Usually pain increases whenever she tries to eat. She also tells me that she has been intermittently nauseated and vomiting and has some epigastric abdominal pains. She has been losing weight, approximately 150 pounds in the past 1-1/2 years. She denies any feeling of food getting stuck in the esophagus. She had EGD done approximately 2 years ago which revealed peptic ulcer disease. That was [place] in [place]. On arrival to the Emergency Room, she was noted to have elevated troponin and hospitalist services were contacted for admission. Her EKG is unremarkable with T depressions in high lateral leads, however, no significant difference since prior EKG done in March 2015. The patient was recently admitted for A-fib, RVR, nausea and vomiting due to gastritis, hyperkalemia, pruritus.  PAST MEDICAL HISTORY: Significant for end-stage renal disease with hemodialysis on Tuesdays, Thursdays, and Saturdays. Last hemodialysis was Tuesday. History of hypertension, however, the patient is not taking any blood pressure medications and her blood pressure was in the 100s recently. History of atrial fibrillation, [place] A that time, she had cardiac catheterization, which was unremarkable. The  patient did have echocardiogram done which revealed decreased cardiac function. Hypertension, history of MRSA, history of depression and anxiety and diabetes. However, the patient reported that apparently her sugars have been abnormally low with hypoglycemia.   ALLERGIES: MIDAZOLAM.  CURRENT MEDICATIONS: According to medical records, the patient is on amiodarone 200 mg p.o. daily, aspirin 325 mg p.o. daily, carvedilol 12.5 mg p.o. at bedtime, Cymbalta 60 mg p.o. daily, iron sulfate 325 mg p.o. twice daily, gabapentin 600 mg p.o. at bedtime, hydroxyzine hydrochloride 10 mg p.o. 3 times daily as needed, lorazepam 1 mg daily, PhosLo 667 mg 2 capsules twice daily, Renagel 1600 mg twice daily, Requip 1 mg p.o. at bedtime,  Sensipar 60 mg daily, trazodone 100 mg p.o. at bedtime, Wellbutrin XL 300 mg p.o. at bedtime, Zofran 8 mg p.o. daily as needed.   SOCIAL HISTORY: Smokes 2 to 3 cigarettes a day. No alcohol, drug abuse.   FAMILY HISTORY: Hypertension and end-stage renal disease in the patient's mother.   REVIEW OF SYSTEMS: Nausea and vomiting intermittently for years now, weight loss of approximately 150 pounds or more in 1-1/2 years, some intermittent chills, temperature of around 100 degrees all the time, feeling fatigue and weak, pains in chest area, epigastric area, intermittent cough with clear phlegm production, sometimes yellow. She has been very sleepy, recently very weak, intermittent shortness of breath whenever she moves around, also chest pains as mentioned above, lower extremity edema which seems to new, also intermittent arrhythmias sometimes associated with chest pains, intermittent nausea and vomiting as mentioned above. Admits of diarrheal stools, sometimes has chunks of black stool. No urine output at all.  CONSTITUTIONAL: Otherwise, denies any high fevers, weight  gain. EYES: Denies blurry vision or double vision, glaucoma or cataracts.  ENT: Denies any tinnitus, allergies, epistaxis,  sinus pain, dentures, or difficulty swallowing.  RESPIRATORY: Denies any cough, wheezing, asthma, or COPD.  CARDIOVASCULAR: Denies chest pain, except as mentioned above. Admits of palpitations. Denies any syncopal episodes.  GASTROINTESTINAL: Denies any hematemesis. No rectal bleeding or change in bowel habits.  GENITOURINARY: Denies dysuria, hematuria, frequency, incontinence.  ENDOCRINE: Denies polydipsia, nocturia, thyroid problems, heat or cold intolerance or thirst.  HEMATOLOGIC: Denies anemia, bruising. INTEGUMENTARY: She did have some left foot injury which she says was related to scratching due to ant bites in [place]. She has bruising in the dorsal aspect of her right foot. NEUROLOGICAL: Admits of having some numbness for the past few months, of the right leg. Denies dysarthria, epilepsy or tremor.  PSYCHIATRIC: Denies anxiety, insomnia, depression.   PHYSICAL EXAMINATION: VITAL SIGNS: On arrival to the hospital, the patient's vital signs are not measured. During my evaluation, her reported temperature was 100, pulse was 108, respiration rate was 16 to 20,  blood pressure 103/60, and saturation was 93% on room air.  GENERAL: This is a well-developed, well-nourished African American female in no acute distress, lying on the stretcher.  HEENT: Her pupils are equal and reactive to light. Extraocular movements intact. No icterus or conjunctivitis. Has normal hearing. No pharyngeal erythema. Mucosa is moist.  NECK: No masses. Supple and nontender. Thyroid not enlarged. No JVD or carotid bruits bilaterally. Full range of motion.  LUNGS: Clear to auscultation in all fields. A few rhonchi were heard. No rales or wheezing. No labored inspirations, increased effort, dullness to percussion or overt respiratory distress.  CARDIOVASCULAR: S1, S2 appreciated. Rhythm was regular, tachycardiac. PMI not lateralized. Chest is nontender to palpation. 1+ pedal pulses. Trace to 1+ lower extremity edema,  especially in ankles as well as feet. She has right foot dorsal aspect bruising and significant swelling. Peripheral pulses are palpable.  ABDOMEN: Soft, minimally tender in the epigastric area. The patient has voluntary guarding. Some discomfort on palpation, also in the lower part of abdomen. No hepatosplenomegaly or masses were noted.  RECTAL: Deferred.  MUSCLE STRENGTH: Able to move all extremities. No cyanosis, degenerative joint disease or kyphosis. Gait was not tested.  SKIN: Revealed right foot lesions, dorsal aspect of the foot, as mentioned above, bluish discoloration of the foot. No other lesions, erythema, nodularity or induration was noted. Skin was warm and dry to palpation.  LYMPHATIC: No adenopathy in the cervical region.  NEUROLOGIC: Cranial nerves grossly intact. Sensory is diminished to light touch of the right leg. No Babinski. No dysarthria or aphasia. The patient is alert AND oriented to time, person and place, cooperative. Memory is good.  PSYCHIATRIC: No significant confusion, agitation, or depression.   DIAGNOSTIC DATA: BMP shows BUN and creatinine 47 and 7.85, potassium 3.4, otherwise BMP unremarkable. Lipase level was 100. Albumin level 2.1, otherwise liver enzymes were normal. Troponin is elevated at 0.7. CK-MB was 1.4. White blood cell count is 7.9, hemoglobin 10, platelet count 256,000. Activated PTT 45.1.   Radiologic studies: Chest x-ray, portable single view, 7th of May 2015, revealed progressive bilateral pulmonary alveolar infiltrates consistent with progressive pulmonary edema. Underlying pneumonia particularly in the right lung could not be excluded, according to radiology.   ASSESSMENT AND PLAN: 1.  Chest pain. Admit to patient to the medical floor. Very concerning for unstable angina since the patient's troponin is elevated. I will get cardiologist involved. Will continue beta blockers, advance  Coreg, add morning dose, also nitroglycerin topically, aspirin as well  as heparin IV. Check cardiac enzymes x3.  2.  Epigastric abdominal pain with weight loss. The patient needs close gastroenterology evaluation, including EGD, as well as possibly colonoscopy. Will initiate the patient on proton pump inhibitor IV twice a day.  3.  End-stage renal disease. Hemodialysis per nephrology.  4.  Anemia. Get stool guaiac.  5.  Abnormal chest x-ray, concerning for possible aspiration pneumonitis. We will follow the patient in the time being and we may need to initiate the patient on Zosyn.   TIME SPENT: 50 minutes.  ____________________________ Katharina Caper, MD rv:sb D: 09/15/2013 11:05:12 ET T: 09/15/2013 11:48:49 ET JOB#: [id number]  cc: Katharina Caper, MD, <Dictator> Serita Sheller. Maryellen Pile, MD Katharina Caper MD ELECTRONICALLY SIGNED 10/25/2013 7:23

## 2014-09-02 NOTE — Consult Note (Signed)
PATIENT NAME:  Veronica Gordon, TROOST MR#:  [id number] DATE OF BIRTH:  [birth year]  DATE OF CONSULTATION:  11/20/2013  REQUESTING PHYSICIAN:   Demetrios Loll, MD   CONSULTING PHYSICIAN:  A. Lavone Orn, MD  CHIEF COMPLAINT: Hypoglycemia.   HISTORY OF PRESENT ILLNESS: This is a 49 year old female with a complicated past medical history to include depression, HIV/AIDS, end-stage renal disease, multilobar pneumonia, with persistent diarrhea, nausea, vomiting, poor nutritional status, and hypoglycemia.  Despite use of IV dextrose, she has had recurrent bouts of low blood sugars on fingerstick and serum blood sugar testing. Blood sugars have been as low as the 30s. Today, blood sugars have improved and are in the 79-128 range, although yesterday sugars were in the  59-102 range. She has a poor appetite and there are likely multiple reasons for this. She reports pain when she swallows. She has a known esophageal stricture. She reports chronic nausea from time of waking to time of going to sleep. She is on scheduled Zofran as well as a proton pump inhibitor. She also has had persistent diarrhea. She has had weight loss, she estimates over 100 pounds. Last weight here was 1 week ago when she was 140 pounds. Multiple notes from other providers indicate she has had poor p.o. intake. She has some specific dietary requests and many things she does not like. She says she is tolerating regular soda, candies, and likes hot dogs and spaghetti. She states she wants to eat and is motivated to eat, though does have early satiety. At this time, she denies abdominal pain or vomiting. She has not noted much of a change in appetite over the last few days. In the past, she has tried Megace to stimulate appetite and recalls it was very effective. She was also hospitalized here in May, during which time she had persistent hypoglycemia as well. I evaluated the patient at that time; however, additional labs for hypoglycemia were not able to  be obtained.   PAST MEDICAL HISTORY:  1. End-stage renal disease.  2. Hypertension.  3. HIV/AIDS.  4. Paroxysmal atrial fibrillation.  5. Depression/anxiety disorder.  6. Esophageal stricture.  7. Anemia of chronic disease.  8. Secondary hyperparathyroidism.  9. Status post EGD showing likely pill ulceration.  10. History of pseudomonal pneumonia, June 2015.  11. 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   CURRENT MEDICATIONS:  1. Zofran 4 mg IV q. 4 hours.  2. Abilify 5 mg daily.  3. Calcium/vitamin D 500/200 one tab b.i.d.  4. Clarithromycin 500 mg q. 12 hours.  5. Myambutol 1200 mg Tuesdays, Thursdays, and Saturdays.  6. Iron 325 mg b.i.d.  7. Neurontin 600 mg at bedtime.  8. Epivir 10 mg/mL solution and 25 mg q. 24 hours.  9. Lidoderm 5% patch once daily.  10. Ritalin 10 mg daily.  11. Remeron 15 mg at bedtime.  12. Protonix 40 mg a.c. breakfast.  13. Raltegravir 400 mg b.i.d.  14. Tenofovir 300 mg once weekly.  15. Procrit 10,000 units as directed with dialysis.   ALLERGIES: No known drug allergies.   SOCIAL HISTORY: The patient is married. She is a smoker.   FAMILY HISTORY: Hypertension and end-stage renal disease.   REVIEW OF SYSTEMS: GENERAL: She has had weight loss. No fevers.  HEENT: Denies blurred vision. She reports sore throat. She reports dry mouth.  NECK: Denies neck pain. Reports odynophagia.  CHEST: Denies chest pain or palpitation.  PULMONARY: No shortness of breath or cough.  ABDOMEN: Appetite is poor. She has  early satiety. She has chronic nausea. She has diarrhea.  EXTREMITIES: She reports extremity pain with minimal movement.  ENDOCRINE:  Denies heat or cold intolerance.  GENITOURINARY: Denies dysuria or hematuria.  NEUROLOGIC: No recent falls. She has had headaches.   PHYSICAL EXAMINATION:  VITAL SIGNS: Height 66.9 inches, weight 139 pounds, BMI 21.9. Temperature 98, pulse 108, respirations 18, blood pressure 130/90, pulse oximetry 95% on room air.   GENERAL: Chronically ill-appearing African American female in no acute distress.  HEENT: There was bitemporal wasting. Mucous membranes are dry. Oropharynx is clear.  NECK: Supple. No appreciable thyromegaly.  CARDIAC: Tachycardic without murmur.  PULMONARY: Clear to auscultation bilaterally.  ABDOMEN: Mild diffuse nonfocal tenderness. No guarding.  EXTREMITIES: Trace edema of the lower extremities bilaterally. Tenderness elicited with minimal movement of the extremities.  SKIN: No rash or dermatopathy on exposed skin.  PSYCHIATRIC: Calm, cooperative.  NEUROLOGIC: Alert.   LABORATORY DATA: Glucose 88, BUN 12, creatinine 2.91, sodium 142, potassium 3.3, chloride 104, EGFR 21.  On 11/16/2013, albumin 1.3, AST 44, alkaline phosphatase 350.   ASSESSMENT: A 49 year old female multiple medical problems to include end-stage renal disease, HIV/AIDS, history of esophageal stricture and pill ulcer, with chronic nausea and diarrhea, noted to have repeated low blood sugars on fingerstick and serum testing. Does not seem to be particularly asymptomatic with hypoglycemia.   PLAN:  1. Suspect her hypoglycemia is multifactorial and due in part to very poor nutritional status. She has a lot of trouble eating in part due to low appetite, odynophagia, and nausea. She is being aggressively treated for nausea with the Zofran and PPI. May be helpful to get a trial of Megace and I will add this today.  2. The patient is taking Ritalin which can be an appetite suppressant. This was not stopped, however could consider holding it to see if it helps stimulate appetite. Though in reviewing notes, it seems to be low appetite was a problem prior to Ritalin being started.  3. Continue to tailor diet to her taste as appropriate.  4. We will ask dietary to continue to bring the Ensure milk shakes at her request, as they have not been given for the last several days.  5. Continue D10 for now.  We will consider reducing it to D5  if we can obtain euglycemia.  6. We will obtain a morning cortisol to rule out adrenal insufficiency.  This is felt to be an unlikely cause of her hypoglycemia.   Thank you for the kind request for consultation. I will follow along with you.   This is a complicated patient.  Time spent on review of chart and with patient was > 60 minutes.   ____________________________ A. Lavone Orn, MD ams:dd D: 11/20/2013 12:52:47 ET T: 11/20/2013 19:19:03 ET JOB#: [id number]  cc: A. Lavone Orn, MD, <Dictator> Sherlon Handing MD ELECTRONICALLY SIGNED 11/21/2013 21:28

## 2014-09-02 NOTE — Op Note (Signed)
PATIENT NAME:  Veronica Gordon, Veronica Gordon MR#:  [id number] DATE OF BIRTH:  Jul 11, 1965  DATE OF PROCEDURE:  09/15/2013  PREOPERATIVE DIAGNOSES:  1. Sepsis.  2. End-stage renal disease.  3. Cardiac myocardial ischemia.   POSTOPERATIVE DIAGNOSES:  1. Sepsis.  2. End-stage renal disease.  3. Cardiac myocardial ischemia.   PROCEDURE PERFORMED: Insertion of right femoral triple-lumen catheter.   SURGEON:  Dr. Levora DredgeGregory Schnier   DESCRIPTION OF PROCEDURE: The patient is in the intensive care unit. She is critically ill. She is positioned supine.  She is having intractable nausea and vomiting and is not able to lay flat for very long and therefore the femoral approach is selected. Risks and benefits are reviewed. All questions are answered. The patient has agreed to proceed.   DESCRIPTION OF PROCEDURE: The right groin is prepped and draped in a sterile fashion. Ultrasound is placed in a sterile sleeve. Femoral vein is identified. It is echolucent and compressible indicating patency. Image is recorded for the permanent record. Under direct visualization, the patient's femoral vein is identified. It is echolucent and compressible indicating patency. Image is recorded for the permanent record and the puncture is made under direct visualization after 1% lidocaine has been infiltrated in the soft tissues. J-wire is then advanced, followed by the dilator. The triple-lumen catheter was then fed over the wire. All 3 lumens aspirate and flush easily, and the catheter is secured to the skin of the thigh with 2-0 silk. Biopatch and a sterile dressing are applied. The patient tolerated the procedure well and there were no immediate complications.    ____________________________ Renford DillsGregory G. Schnier, MD ggs:dd D: 09/15/2013 20:42:30 ET T: 09/16/2013 05:35:27 ET JOB#: [id number]  cc: Renford DillsGregory G. Schnier, MD, <Dictator> Renford DillsGREGORY G SCHNIER MD ELECTRONICALLY SIGNED 10/11/2013 12:27

## 2014-09-02 NOTE — Consult Note (Signed)
   Comments   Pt in endoscopy. Will follow up at later time.   Electronic Signatures: Callee Rohrig, Harriett SineNancy (MD)  (Signed 02-Jul-15 16:22)  Authored: Palliative Care   Last Updated: 02-Jul-15 16:22 by Evanee Lubrano, Harriett SineNancy (MD)

## 2014-09-02 NOTE — Consult Note (Signed)
Patient in dialysis when I came to her room.  discussed briefly her dysphagia with Dr. Dorris FetchWheiting and recommended empiric Nystatin oral for now.  Consider barium swallow early next week, hold on EGD due to pneumonia.  Electronic Signatures: Scot JunElliott, Dresean Beckel T (MD)  (Signed on 09-May-15 13:29)  Authored  Last Updated: 09-May-15 13:29 by Scot JunElliott, Hallelujah Wysong T (MD)

## 2014-09-02 NOTE — Consult Note (Signed)
Brief Consult Note: Diagnosis: Elevated bilirubin, abdominal/chest pain.   Patient was seen by consultant.   Comments: Ms. 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  She has been having chest pain, epigastric pain, nausea, vomiting & hyperbilirubinemia with total bili greater than 13.    She has been on raltegravir, lamivudine, tenofovir, rifampin, INH, ethambutol, fluconazole & clarithromyin, along with other potentially hepatotoxic medications.  Will check acetaminophen levels.  This is likely hepatocellular drug-induced injury.  She has chronic chest & abdominal pain as well.  I haver discussed with Dr Sherryll BurgerShah, ER physician & Dr Servando SnareWohl.  [facility] to optimize her anti-viral/antibiotic therapy & minimize hepatotoxicity.    Plan: 1) Await CT scan 2) ID involvement will be needed to adjust culprit medications 3) recommend PPI BID 4) acetaminophen level, direct bili Thanks for allowing us to participate in her care.  Please see full dictated note. #[id number].  Electronic Signatures: Joselyn ArrowJones, Sabrena Gavitt L (NP)  (Signed 31-Jul-15 15:15)  Authored: Brief Consult Note   Last Updated: 31-Jul-15 15:15 by Joselyn ArrowJones, Meria Crilly L (NP)

## 2014-09-02 NOTE — Consult Note (Signed)
Results of barium swallow noted. Will eventually plan on EGD. Unfortunately, endoscopy unit is essentially closed tomorrow due to staffing issues except for true emergencies. If patient plans to be here, then I can schedule EGD on Thurs. If stable, patient can be discharged from GI point of view. We can arrange outpt EGD on non dialysis days. thanks.  Electronic Signatures: Lutricia Feilh, Tereasa Yilmaz (MD)  (Signed on 12-May-15 09:16)  Authored  Last Updated: 12-May-15 09:16 by Lutricia Feilh, Esbeydi Manago (MD)

## 2014-09-02 NOTE — Consult Note (Signed)
Brief Consult Note: Diagnosis: Anemia.   Patient was seen by consultant.   Consult note dictated.   Comments: Patient seen and examined. She is not very talkative, appears very tiresome. She was readmitted with a Hgb of 4.5 after about a month long hospital stay. Hgb at d/c on 11/21/13 was 8.0.  The patient denies any overt Gi bleeding. Denies nausea ,vomiting, heartburn, abdominal pain, changes to her bowel habits. no black or bloody stools. She recently underwent an EGD on 11/10/13 by Dr Servando SnareWohl notable for an esophageal ulcer and duodenitis. Colonoscopy also performed noting Petechiae.   Agree with serial hgbs, transfusing as necessary. IV PPI. Will order Iron studies, FOBT. Recommend Hematology consult for severe anemia with pancytopenia. May be bone marrow suppression. Will follow. Full consult being dictated..  Electronic Signatures: Brantley StageEarle, Ambra Haverstick M (PA-C)  (Signed 22-Jul-15 15:43)  Authored: Brief Consult Note   Last Updated: 22-Jul-15 15:43 by Ashok CordiaEarle, Jeson Camacho M (PA-C)

## 2014-09-02 NOTE — Consult Note (Signed)
PATIENT NAME:  Veronica Gordon, MATAR MR#:  [id number] DATE OF BIRTH:  [birth year]  INFECTIOUS DISEASE CONSULTATION NOTE  DATE OF CONSULTATION:  10/22/2013  REFERRING PHYSICIAN:  Felipa Furnace, MD CONSULTING PHYSICIAN:  Stann Mainland. Sampson Goon, MD  REASON FOR CONSULTATION: Pneumonia, positive AFB.   HISTORY OF PRESENT ILLNESS: This is a pleasant but unfortunate 49 year old female who has end-stage renal disease and has been on dialysis for 3 years. She was admitted June 6th with nausea, vomiting, abdominal pain and diarrhea. She has had a recent admission as well and was discharged May 16th after being treated for pneumonia and dysphagia. Since admission, the patient has had somewhat of a decline. She was in the intensive care unit for a while. She had a bronchoscopy done that showed thick secretions. Cultures are pending from that; however, AFB smears have come back positive. Her other issue currently is profuse watery diarrhea. Although the C. difficile has been negative, she is being treated for C. difficile clinically. She also had complications with some ischemic colitis and septic shock, requiring pressors.   PAST MEDICAL HISTORY:  1. End-stage renal disease, on hemodialysis for approximately 3 years.  2. Recent issues with dysphagia.  3. Anemia of chronic disease.  4. Hypertension.  5. Esophageal stricture.  6. Proximal atrial fibrillation.  7. History of MRSA in the past.  8. Depression.  9. Anxiety.  10. History of hypoglycemia.   SOCIAL HISTORY: The patient smokes 2 to 3 cigarettes a day. No alcohol or drugs. She lives at home with her husband.   ALLERGIES: SHE IS ALLERGIC TO VERSED.   FAMILY HISTORY: Positive for hypertension and end-stage renal disease.   REVIEW OF SYSTEMS: Eleven systems were reviewed and negative except as per HPI.   ANTIBIOTICS SINCE ADMISSION: Include azithromycin June 5th through the 10th, meropenem June 7th through the 12th, metronidazole June  11th and 12th, Zosyn June 5th through the 7th.   PHYSICAL EXAMINATION:  VITAL SIGNS: Temperature 98.9, pulse 93, blood pressure 117/77, respirations 20, saturation 91% on 5 liters.  GENERAL: She is quite chronically ill-appearing.  HEENT: Pupils equal, round and reactive to light and accommodation. Extraocular movements are intact. Sclerae are anicteric. Oropharynx is clear. She has no thrush.  NECK: Supple.  HEART: Tachy but regular.  LUNGS: Coarse breath sounds bilaterally with crackles and poor air movement.  ABDOMEN: Soft, mildly distended, mildly tympanic, mildly tender to palpation.  EXTREMITIES: No clubbing, cyanosis or edema.  NEUROLOGIC: She is alert and oriented x3. Grossly nonfocal neuro exam. MOOD: She is quite depressed appearing.  DATA: White blood count on admission was 8.5, currently it is 4.4, hemoglobin 9.2, platelets 108. She has quite a low lymphocyte count at 400. Renal function is consistent with end-stage renal disease. LFTs on June 11th show very low albumin of 1.5, alkaline phosphatase is 155, AST 37, ALT 10. Microbiology: Blood cultures June 6th are negative x2. C. difficile June 8th positive for antigen, but negative for PCR. Stool culture on June 8th: Negative stool, white blood count showed no white blood cells seen. Fungal culture from BAL June 10th showed no yeast or fungus isolated. Sputum culture June 10th showed rare gram-negative rod, otherwise is pending. AFB is reportedly positive on that.   IMAGING: CT of her chest, abdomen and pelvis done June 9th shows mild shotty mediastinal lymph nodes and hilar lymph nodes. Dense bilateral patchy pulmonary infiltrates are noted. There is associated nodular cavitary lesions, particularly in the upper lobes. There is acute  granulomatous disease as possibility. Metastatic disease cannot be excluded. CT of the abdomen and pelvis shows diffuse fatty infiltration of the liver, shotty inguinal and retroperitoneal adenopathy. The  abdominal aorta is widely patent. Appendix is not visualized. There is diffuse abdominal and pelvic wall anasarca.   IMPRESSION: A 49 year old with end-stage renal disease, admitted with diarrhea, also found to have extensive multilobar pneumonia and some cavitary lesions. There is a high concern for tuberculosis. I am also suspicious for human immunodeficiency virus. She says her last test was 10 years ago.   RECOMMENDATIONS:  1. Check HIV rapid test.  2. Continue current antibiotics. She is clinically improving per report. If she continues to improve, we could wait until further ID of the AFB prior to initiating TB treatment. There is a possibility this could be MAI. If she declines, I would empirically start 4-drug TB treatment. Also, if her HIV test is positive, our suspicion for TB would be much higher, and I would empirically treat her.   Thank you for the consult. I will be glad to follow with you.    ____________________________ Stann Mainlandavid P. Sampson GoonFitzgerald, MD dpf:lb D: 10/22/2013 11:59:00 ET T: 10/22/2013 12:52:43 ET JOB#: [id number]  cc: Stann Mainlandavid P. Sampson GoonFitzgerald, MD, <Dictator> Johari Bennetts Sampson GoonFITZGERALD MD ELECTRONICALLY SIGNED 10/24/2013 8:35

## 2014-09-02 NOTE — H&P (Signed)
PATIENT NAME:  Veronica Gordon, Veronica Gordon MR#:  [id number] DATE OF BIRTH:  06/27/1965  DATE OF ADMISSION:  11/29/2013  PRIMARY CARE PHYSICIAN: None.   CHIEF COMPLAINT: Weakness and anemia.   HISTORY OF PRESENT ILLNESS:  This is a 49 year old female who was recently discharged from the hospital on July 14, after over 1 month stay. She has a history of AIDS and disseminated mycobacterium, AVM intracellulare infection, end-stage renal disease on hemodialysis and anemia due to chronic disease and thrombocytopenia. She presented from her rehabilitation center with weakness, found to have a hemoglobin of 4.5. Her last hemoglobin on the 13th of July was 8.0.   REVIEW OF SYSTEMS:  CONSTITUTIONAL: Denies any fevers. Positive fatigue, weakness. EYES:  No blurred or double vision, glaucoma or cataracts.  ENT: No tinnitus, ear pain, hearing loss, postnasal drip.  RESPIRATORY: No cough, wheezing, hemoptysis, COPD.  CARDIOVASCULAR: No chest pain, orthopnea, PND, positive dyspnea on exertion.  GASTROINTESTINAL: She has nausea. No diarrhea, vomiting, abdominal pain. GENITOURINARY:  The patient has end-stage renal disease, does not make urine.  ENDOCRINE: No polyuria, nocturia, or thyroid problems.   HEMATOLOGIC/LYMPHATICS:  Positive anemia.  No bleeding.  No melena.  Increased sedated.  SKIN: No rash or lesions. MUSCULOSKELETAL: Positive generalized weakness. NEUROLOGIC:  No history of CVA, TIAs, or seizures.   PAST MEDICAL HISTORY: 1.  End-stage renal disease on hemodialysis. 2.  AIDS.   3.  Anemia of chronic disease.  She had EGD during her last hospitalization which showed esophageal ulcer and duodenitis. She had a colonoscopy, which showed petechiae.   4.  Hypertension.  5.  Esophageal stricture.  6.  Paroxysmal atrial fibrillation.  7.  History of MRSA.  8.  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 9.  History of anxiety and depression.  10.  History of hypoglycemia.  11.  Protein  calorie malnutrition.  12.  Thrombocytopenia.   MEDICATIONS: 1.  Zofran 8 mg daily p.o.  2.  Trazodone 100 mg daily.  3.  Tenofovir 300 mg daily.  4.  Requip 1 mg at bedtime.  5.  Raltegravir 400 mg t.i.d.  6.  PhosLo 667 two tablets b.i.d.  7.  Pantoprazole 40 mg daily.  8.  Palonosetron 30 mL q.4h. p.r.n.  9.  Nystatin 5 mg q.6 hours p.r.n.  10.  Nicotine inhaler kit.  11.  Remeron 15 mg at bedtime.  12.  Methylphenidate 10 mg daily.  13.  Megace 40 mg b.i.d.  14.  Ativan 1 mg daily.  15.  Loperamide 2 mg 4 times a day p.r.n.  16.  Lidocaine topical daily.  17.  Lamivudine 2.5 mL daily.  18.  Gabapentin 600 mg daily.  19.  Ethambutol 400 mg 3 tablets Tuesday, Thursday, Saturday.  20.  Ensure daily.  21.  Dextrose D10 half-normal saline at 50 mL an hour.  22.  Clarithromycin 500 mg q. 12 hours.  23.  Calcium vitamin D 1 tablet b.i.d. 24.  Aspirin 81 mg daily.  25.  Abilify 5 mg daily.  26.  Tylenol 1 tablet q. 4 hours p.r.n.   ALLERGIES: No known drug allergies.   PAST SURGICAL HISTORY:  The patient has a dialysis fistula.    SOCIAL HISTORY: The patient is at a rehabilitation center not smoking currently. No alcohol or IV drug use.   FAMILY HISTORY: Positive for end-stage renal disease and hypertension.  PHYSICAL EXAMINATION: VITAL SIGNS: Temperature 98, pulse of 94, respirations 20, blood pressure 104/69, 100% on room  air.  GENERAL: The patient just looks older than her stated age, frail-appearing, not in acute distress.  HEENT: Head is atraumatic. Pupils are round.   Sclerae are anicteric.  Mucous membranes are dry.   Oropharynx is clear.  NECK: Supple. No JVD, carotid bruit, or enlarged thyroid. Trachea is midline.  CARDIOVASCULAR: Regular rate and rhythm. No murmurs, gallops, or rubs.  LUNGS: Clear to auscultation without crackles, rales, wheezing. Normal percussion. No use of accessory muscles.  ABDOMEN: Bowel sounds are positive. Nontender, nondistended.   EXTREMITIES: No clubbing, cyanosis or positive 3+edema.  SKIN: Without rash or lesions. NEUROLOGIC:  Cranial nerves II-XII are grossly intact.  No focal deficits.  The patient has just generalized weakness.   LABORATORY DATA: White blood cells 3, hemoglobin 4.5, hematocrit 14.3, platelets are 82,000. Troponin is 0.07, total protein 4.8, albumin 1.0, bilirubin 0.9, alkaline phosphatase 302, AST 44, ALT 20. Sodium 143, potassium 3.1, chloride 107, bicarbonate 27, BUN 12, creatinine 2.69. Glucose is 78, phosphorus is 6.2.   RADIOLOGY DATA:  Chest x-ray shows no acute cardiopulmonary disease.   ASSESSMENT AND PLAN: A 49 year old female with a history of AIDS, disseminated mycobacterium avium-intracellulare, thrombocytopenia end-stage renal disease on hemodialysis, protein calorie malnutrition, who presents from her rehabilitation center with anemia.  1.  Acute blood loss anemia. The patient had an EGD on 11/10/2013, which showed a nonbleeding esophageal ulcer and duodenitis. She had been on a PPI. She also had a colonoscopy, which showed petechiae, but no major bleeding. She now presents with significant anemia. She was on Protonix 40 IV q. 12 hours.  We will consult GI, hold aspirin at this time and monitor her hemoglobin closely. ER physician has consented her for blood transfusion.  2.  AIDS.  Will continue outpatient medications.  4.  Disseminated mycobacterium avium-intracellulare. The patient is on ethambutol and clarithromycin, which we will continue.  5.  Hypoglycemia. The patient was discharged with a D10 drip due to persistent hypoglycemia. I have written for some D5 at 50 an hour.  We will closely monitor her blood sugars.  6.  Elevated troponin. This is secondary to demand ischemia from her significant anemia. The patient had undergone a cardiac catheterization in May 2015 by Dr. Neoma Laming which essentially showed EF of 53% and normal coronary arteries. However, we will continue to trend  her troponins.  7.  The patient has had nausea, inability to eat in the past. We will continue to monitor closely, encourage p.o. intake as tolerated.  8.  End-stage renal disease on hemodialysis. Nephrology has been consulted for dialysis.  9.  The patient is a full code status.  10.  The patient may benefit from a palliative care consult if this is a prolonged hospitalization.   TIME SPENT: Approximately 50 minutes.   ____________________________ Donell Beers. Benjie Karvonen, MD spm:ds D: 11/29/2013 19:51:47 ET T: 11/29/2013 20:17:31 ET JOB#: [id number]  cc: Donjuan Robison P. Benjie Karvonen, MD, <Dictator> Donell Beers Bocephus Cali MD ELECTRONICALLY SIGNED 12/02/2013 21:33

## 2014-09-02 NOTE — Consult Note (Signed)
PATIENT NAME:  Veronica Gordon, Veronica Gordon MR#:  [id number] DATE OF BIRTH:  07-Nov-1965  DATE OF CONSULTATION:  10/16/2013  CONSULTING SERVICE: Gastroenterology    CONSULTING PHYSICIAN:  Midge Minium, MD  REASON FOR CONSULTATION: Nausea, vomiting, diarrhea and dysphagia.   HISTORY OF PRESENT ILLNESS: This patient is a 49 year old woman, who was recently discharged from the hospital on May 16 for the same issues. The patient was admitted this time for pneumonia and complaining of weakness. The patient was not scoped at the time of the last admission despite the nausea, vomiting, diarrhea, abdominal pain and dysphagia, but was sent out to have a scope as an outpatient by Dr. Bluford Kaufmann. The patient now was admitted with an elevated temperature of 100.1 and tachycardia. The hospitalist consulted GI for a possible EGD while the patient was in the hospital. The patient has been tachypneic, and she is now being treated for pneumonia. The patient also has end-stage renal disease, and is on hemodialysis.   PAST MEDICAL HISTORY: End-stage renal disease, anemia of chronic disease, hypertension, esophageal stricture, AFib. Cardiac cath on May 15 showed normal coronary arteries. MRSA, depression, anxiety, hypoglycemia.   ALLERGIES: VERSED.   SOCIAL HISTORY: The patient smokes 2 to 3 cigarettes a day. No alcohol or drug abuse.   FAMILY HISTORY: Noncontributory.  REVIEW OF SYSTEMS:  A 10-point review of systems was reviewed, and was not significant for anything in the HPI. The patient was very stuporous and lethargic when I saw her today, so was not able to answer all the questions.   HOME MEDICATIONS: Acetaminophen/codeine, aspirin, Bentyl, Coreg, Cymbalta, iron, gabapentin, hydroxyzine, lorazepam, metoclopramide, nystatin, ropinirole, Sensipar, Wellbutrin, trazodone, and Zofran.   PHYSICAL EXAMINATION: GENERAL: The patient is stuporous, will rouse to noxious stimuli, but then closed her eyes again.  VITAL SIGNS:  Temperature 99.4, pulse 102, respirations 18, blood pressure 100/64, pulse oximetry 91%.  HEENT: Normocephalic, atraumatic. Extraocular motor intact. Pupils equally round and reactive to light and accommodation.  LUNGS: With decreased breath sounds.  HEART: Slightly tachycardic.  ABDOMEN: Soft, nontender, nondistended, without hepatosplenomegaly.  EXTREMITIES: Without cyanosis, clubbing, or edema.  NEUROLOGIC: Unable to obtain due to the patient being lethargic.  SKIN: Without any rashes or lesions.  ANCILLARY SERVICES: White cell count 7, hemoglobin 9.5, hematocrit 30.1.   ASSESSMENT AND PLAN: This patient is a 49 year old woman who has a diagnosis of nausea, vomiting, abdominal pain and diarrhea, with dysphagia. The patient, as per nursing staff, has not had any of the above complaints, except for some dysphagia. She has not complained of nausea or diarrhea. The patient was set up for an upper endoscopy for today due to her dysphagia and esophageal stricture, but when the patient was sent to be picked up for the procedure, she was lethargic and unable to answer questions, with shortness of breath, and was not stable to have her procedure. Would recommend having the patient have her procedure as planned with Dr. Bluford Kaufmann as an outpatient. I do not think this patient is going to be stable enough for upper endoscopy with her recent episode of pneumonia. I think it would be safer to let the patient recuperate, regain her pulmonary function before sedating her and doing a procedure which may increase her risk of bad outcome if she has pneumonia and shortness of breath.   Thank you very much for involving me in the care of this patient. If you have any questions, please do not hesitate to call.     ____________________________ Ebony Hail  Servando SnareWohl, MD dw:mr D: 10/16/2013 15:39:56 ET T: 10/16/2013 18:41:39 ET JOB#: [id number]  cc: Midge Miniumarren Naftuli Dalsanto, MD, <Dictator> Midge MiniumARREN Tran Arzuaga MD ELECTRONICALLY SIGNED 10/17/2013 7:18

## 2014-09-02 NOTE — Consult Note (Signed)
Psychiatry: Follow-up visit to this woman with major depression.  The on-call hospitalist called me this morning to report that the patient was acting bizarrely.  The patient appeared to be agitated this morning and had made statements about wishing she were dead.  My interview today I found that the patient states that she was feeling like she was "tripping" this morning and was not herself.  She says that she thinks someone gave her some bad medicine.  He says this went on for an hour or so and now she is feeling tired and feeling back to her normal self.  Currently she states that her mood is better.  She denies any current suicidal ideation.  Denies any hallucinations.  She says she is breathing a little bit better.  He'll has some pain when she tries to swallow.  Makes little eye contact.  Speaks minimally.  Affect blunted mood stated as being okay.  Thoughts are lucid.  Denies hallucinations denies suicidal ideation.  Judgment and insight are adequate. reviewed her medicines and I don't see any obvious reason why her medication would've made her feel or act strangely this morning.  I'm not going to change any of her medicine.  I will check up on her tomorrow as well and see if she has the same spell in the morning or not.  Supportive and educational counseling done.  Follow-up as needed.  Electronic Signatures: Audery Amellapacs, John T (MD)  (Signed on 20-Jun-15 20:45)  Authored  Last Updated: 20-Jun-15 20:45 by Audery Amellapacs, John T (MD)

## 2014-09-02 NOTE — Consult Note (Signed)
Psychiatry: Follow-up for this 49 year old woman with AIDS and pneumonia.  Today she reports she is feeling a little better than she was yesterday.  She denies feeling depressed.  Denies suicidal ideation.  Says she does have pain when she breathes and particularly pain when she is swallowing which is no better.  Still feels fatigued.  Feels a little bit more positive because she was told that she did not have tuberculosis. of systems denies suicidal ideation denies hallucinations.  Trouble swallowing.  Fatigue.  Trouble breathing. status exam shows a chronically ill woman currently on dialysis.  Cooperative with the interview.  Eye contact better.  Psychomotor activity still sluggish.  Speech little bit louder and easier to understand.  Affect more reactive and euthymic mood stated as being okay.  Thoughts are lucid with no evidence of delusions denies hallucinations denies suicidal or homicidal ideation.  Judgment and insight adequate. appears to have some improvement today.  Still does not appear to be an acute suicide risk.  Tolerating medicine change well.  No sign of withdrawal from duloxetine.  Continue off the duloxetine and on the mirtazapine.  Education provided for the patient.  I will follow up as needed. Major depression recurrent moderate  Electronic Signatures: Clapacs, Jackquline DenmarkJohn T (MD)  (Signed on 18-Jun-15 15:43)  Authored  Last Updated: 18-Jun-15 15:43 by Audery Amellapacs, John T (MD)

## 2014-09-02 NOTE — Consult Note (Signed)
PATIENT NAME:  Veronica Gordon, Veronica Gordon MR#:  [id number] DATE OF BIRTH:  1965/12/17  DATE OF CONSULTATION:  09/15/2013  REFERRING PHYSICIAN:   CONSULTING PHYSICIAN:  Ezzard StandingPaul Y. Bluford Kaufmannh, MD  REASON FOR REFERRAL: Odynophagia.   DESCRIPTION: The patient is a 49 year old black female with end-stage renal disease who was admitted with significant chest pain that started about a week ago. He seems like there is some odynophagia with burning when she swallows food. She did not have any dysphagia. She has had some nausea and vomiting as well as some epigastric pain. She has been losing some weight as well. She recalls [place] She had an ulcer disease related to NSAID use. She also recalls having a colonoscopy around the same time, which according to her was normal. When she came to the Emergency Room she was found to have elevated troponin level with some ST-T changes in the lateral leads. She had a stress test done by her cardiologist a week or 2 weeks ago which was abnormal. She was supposed to have an outpatient cardiac catheterization scheduled. She feels that the symptoms started ever since the stress test. She has been feeling weak. She has some numbness in her body and achy feeling throughout, even extending to her arms.   PAST MEDICAL HISTORY: Notable for end-stage renal disease for which she is on hemodialysis every Tuesday, Thursday and Saturday. It feels like her symptoms of nausea, vomiting, and diarrhea started ever since she was placed in dialysis. She also has a history of atrial fibrillation. She had a cardiac catheterization at some point which was supposedly negative. Other history includes a history of MRSA and depression and anxiety and diabetes.   ALLERGIES: MIDAZOLAM.  HOME MEDICATIONS: Includes amiodarone 200 mg daily, full strength aspirin daily, Carvedilol 12.5 mg at bedtime. She also takes iron twice a day, gabapentin 600 mg at bedtime,  hydroxyzine hydrochlorothiazide 10 mg 3 times a day as needed, Cymbalta daily, lorazepam daily. She also takes phosphorus and Renagel and Requip. Other medications include Wellbutrin,  trazodone, Sensipar and Zofran as needed.   SOCIAL HISTORY: She smokes 2 to 3 cigarettes a day. She denies any alcohol use.   FAMILY HISTORY: Notable for renal disease and hypertension.   REVIEW OF SYSTEMS: She has had weight loss. She has been complaining of weakness. There are some chills, but no fever. There is chest pain and epigastric pain. She has had some occasional coughing. There is no gross hematochezia or melena. There are no visual changes or hearing changes. The rest of the review of systems are negative.   PHYSICAL EXAMINATION: GENERAL: The patient is currently getting hemodialysis. She feels cold but she is afebrile. VITAL SIGNS: Stable. HEENT: Normocephalic, atraumatic head. Pupils are equally reactive. Throat was clear.  NECK: Supple.  CARDIAC: Regular rhythm and rate. I did not appreciate any murmurs today.  LUNGS: Clear bilaterally.  ABDOMEN: Normoactive bowel sounds, soft. There is some mild tenderness in the epigastrium. There is no hepatomegaly. She has good active bowel sounds.  EXTREMITIES: No clubbing, cyanosis, or edema. She does have a shunt in place.  NEUROLOGIC: Nonfocal.   DIAGNOSTIC DATA: Creatinine 7.85, sodium 136, potassium 3.4, glucose 84, BUN 47.  Liver enzymes were normal. Troponin level was 0.70 then it went up to 0.57. Hemoglobin was 10 and white count 7.9. The patient was already placed on IV heparin.   ASSESSMENT AND PLAN: This is a patient who came  in with unstable angina. She is already ruled in for myocardial infarction. She is already on heparin drip. She does have some odynophagia. It is possible she may have some reflux disease. I agree with Protonix twice a day. I agree with heparin as long as the patient is not actively bleeding. The patient's cardiologist has  already seen the patient and the patient is scheduled for cardiac catheterization tomorrow. Due to her current cardiac status, there is no immediate plans for upper endoscopy. I would be high risk to perform any procedures at this time. If the symptoms persist, can consider doing an upper gastrointestinal series while the patient is in the hospital. Will continue to follow the patient. Thank you for the referral.   ____________________________ Ezzard Standing. Bluford Kaufmann, MD pyo:sb D: 09/16/2013 08:42:00 ET T: 09/16/2013 09:23:20 ET JOB#: [id number]  cc: Ezzard Standing. Bluford Kaufmann, MD, <Dictator> Ezzard Standing Georga Stys MD ELECTRONICALLY SIGNED 09/21/2013 12:32

## 2014-09-02 NOTE — Consult Note (Signed)
Brief Consult Note: Diagnosis: SEVERE ANEMIA, PANCYTOPENIA.   Patient was seen by consultant.   Discussed with Attending MD.   Comments: PRIOR DISCUSSED WITH DR Sampson GoonFITZGERALD AND DR LATEEF... CBC ABNORMALITIES, , ANEMIA, MULTIFACTORIAL, CONSISTENT WITH SEVERE KNOWN INFECTIONS AND MALNUTRITION. HGB SLIGHTLY OUT OF PROPORTION BUT NO EVIDENCE OF HEMOLYSIS. . NO SUSPICION OF OTHER UNDERLYING HEME DISORDER. COOMBS NEG, PARVOVIRUS PENDING,. PRIOR NOTED ON SCANS SHOTTY LYMPHADENOPATHY CONSISTENT WITH HIV....Marland Kitchen.SUGG, WATCH FOR BLEED, CHECK PARVOVIRUS, WOULD REPEAT IRON AND TIBC, SERIAL CBC, CAN CHECK RETIC COUNT.  CHECK HAPTOGLOBIN. WOULD NOT DO BONE MARROW.  NOTHING ELSE FROM HEME/ONC. NEPHROLOGY FOLLOWS AND SUPPLEMENTS IV IRON AND PROCRIT, SHOULD LOOK AT MAXIMUM DOSES. AS DISCUSSED WITH DR Sampson GoonFITZGERALD, LATER NEEDS INFECTIOUS DISEASE F/U, AND IF PATIENT IMPROVED AND CBC REMAINED ABNORMAL WOULD RECONSULT HEME ONC. ALSO WHEN HIV HAS BEED TREATED, SHOULD HAVE F/U CT TO R/U ADENOPATHY, TO R/O HIV ASOCIATED LYMPHOMA.  Electronic Signatures: Marin RobertsGittin, Matheu Ploeger G (MD)  (Signed 23-Jul-15 14:21)  Authored: Brief Consult Note   Last Updated: 23-Jul-15 14:21 by Marin RobertsGittin, Anne Sebring G (MD)

## 2014-09-02 NOTE — Discharge Summary (Signed)
PATIENT NAME:  Veronica Gordon, Veronica Gordon MR#:  [id number] DATE OF BIRTH:  1965/06/10  DATE OF ADMISSION:  11/09/2013 DATE OF DISCHARGE:    PRIMARY CARE PHYSICIAN: Dr. Maryellen PileEason   INTERIM DISCHARGE SUMMARY: For detailed history and physical examination, please refer to the admission note dictated by Dr. Elpidio AnisSudini and the interim summary dictated by Dr. Winona LegatoVaickute and Dr. Juliene PinaMody.  1. Bilateral aspiration. Pseudomonas pneumonia with septic shock, improved. The patient was on Cipro for Pseudomonas infection, which was stopped after 21 days of therapy.  2. Disseminated MAI infection. AFB positive, but a TB PCR is negative. The patient is on clarithromycin.  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 The patient is on heart therapy according to Dr. Jarrett AblesFitzgerald's recommendation. In addition, the patient on Bactrim for PCP and toxoplasmosis prophylaxis.  4. Acute respiratory failure, improved. 5. Encephalopathy, chronic, is improving.  6. Fever resolved.  7. Diarrhea resolved.  8. ESRD.  On hemodialysis as scheduled.  9. Hyponatremia, improved.  10. Anemia. The patient's hemoglobin decreased to 6.7 today. We will give her 1 unit of PRBC transfusion. Follow up hemoglobin. Continue Procrit per nephrologist.   11. Persistent hypoglycemia. The patient's blood sugar was low 50s for a few days but better today. Today's blood sugar is about 90-128. The patient has been treated with D10 IV continuously with D50 IV p.r.n.  Patient has a very poor oral intake, possibly from depression.  Follow up with Dr. Tedd SiasSolum for persistent hypoglycemia. 12. Anxiety and depression. The patient is on Remeron, Wellbutrin, and mirtazapine and Abilify per Dr. Toni Amendlapacs.    RECOMMENDATION AND DISCHARGE PLAN:  Hopefully, patient will be discharged to a skilled nursing facility next week.      ____________________________ Shaune PollackQing Roxy Filler, MD qc:dd D: 11/20/2013 14:18:36 ET T: 11/20/2013 21:31:07 ET JOB#: [id number]  cc: Shaune PollackQing Daymeon Fischman, MD,  <Dictator> Shaune PollackQING Evaline Waltman MD ELECTRONICALLY SIGNED 11/21/2013 23:34

## 2014-09-02 NOTE — H&P (Signed)
PATIENT NAME:  Veronica Gordon, Veronica Gordon MR#:  [id number] DATE OF BIRTH:  [birth year]  DATE OF ADMISSION:  10/15/2013  PRIMARY CARE PHYSICIAN:  Dr. Maryellen Pile.  NEPHROLOGY:  Dr. Cherylann Ratel.   CHIEF COMPLAINT:  Nausea, vomiting, abdominal pain, diarrhea, and dysphagia.   HISTORY OF PRESENT ILLNESS:  A 49 year old African American female patient who was recently discharged from the hospital on 09/24/2013 after being treated for dysphagia, pneumonia, returns to the Emergency Room complaining of weakness.  The patient has had some nausea, vomiting, diarrhea, dysphagia, abdominal pain for the past week.  The patient was recently seen in the Emergency Room and discharged back home after finding nothing acute.   The patient mentions that after leaving from the hospital she did well.  Her dysphagia did improve today.  She was working with physical therapy, ambulating on her own without shortness of breath, continuing dialysis, but over the last one week the patient has worsened.  She was supposed to get an outpatient EGD with Dr. Bluford Kaufmann, but she has not been able to get in touch with his office.   Today, the patient has been found to have a temperature of 100.1, 99.9 along with being tachycardic in the one-teens and is being admitted to the hospitalist service after her chest x-ray showed a right lower lobe infiltrate.  It is not clear if this is a persistent infiltrate from her previous pneumonia or a new infiltrate, but she does have nausea and vomiting.  She had one loose stool here in the hospital and vomiting.   PAST MEDICAL HISTORY: 1.  End-stage renal disease on hemodialysis.  2.  Anemia of chronic disease.  3.  Hypertension.  4.  Esophageal stricture of unknown etiology.  5.  Paroxysmal atrial fibrillation.  6.  Recent cardiac cath in May 2015 which showed normal coronary arteries.  7.  History of MRSA.  8.  Depression.  9.  Anxiety.  10.  History of hypoglycemia.   ALLERGIES:  VERSED.   SOCIAL HISTORY:   The patient smokes 2 to 3 cigarettes a day.  No alcohol.  No illicit drug use.  Lives at home with her husband.   CODE STATUS:  FULL CODE.   FAMILY HISTORY:  Hypertension, end stage renal disease in her mother.   REVIEW OF SYSTEMS: CONSTITUTIONAL:  Complains of fatigue, weakness.  EYES:  No blurred vision, pain, redness. EARS, NOSE, THROAT:  No tinnitus, ear pain, hearing loss.  RESPIRATORY:  No cough, wheeze, hemoptysis.  CARDIOVASCULAR:  Has right-sided pleuritic chest pain.  No orthopnea.  Does have some edema.  GASTROINTESTINAL:  Has nausea, vomiting, diarrhea, abdominal pain.  GENITOURINARY:  Has end-stage renal disease.  ENDOCRINE:  No polyuria, nocturia, thyroid problems.  HEMATOLOGIC AND LYMPHATIC:  Has anemia of chronic disease.  No bleeding.  INTEGUMENTARY:  No acne, rash, lesion.  MUSCULOSKELETAL:  No back pain, arthritis.  NEUROLOGICAL:  No focal numbness, has generalized weakness.   HOME MEDICATIONS: 1.  Acetaminophen oxycodone 325/5 1 tablet every eight hours as needed for pain.  2.  Aspirin 81 mg daily.  3.  Bentyl 10 mg oral 2 times a day.  4.  Calcium acetate 667 mg oral 1 capsule 3 times a day.  5.  Coreg 12.5 mg once a day.  6.  Cymbalta 60 mg daily.  7.  Ferrous sulfate 325 mg oral 2 times a day.  8.  Gabapentin 600 mg oral once a day.  9.  Hydroxyzine 1 tablet oral 3 times  a day as needed for itching.  10.  Lorazepam 1 mg daily.  11.  Metoclopramide 5 mg oral 3 times a day before meals.  12.  Nystatin 100,000 units 5 mL oral every six hours.  13.  Ropinirole 1 mg oral once a day.  14.  Sensipar 60 mg daily.  15.  Trazodone 100 mg daily.  16.  Wellbutrin XL 300 mg once a day.  17.  Zofran 8 mg oral 3 times a day as needed for nausea, vomiting.   PHYSICAL EXAMINATION: VITAL SIGNS:  Temperature of 100.1, pulse of 112, respirations 18, blood pressure 121/78, saturating 97% on room air.  GENERAL:  Moderately built [place] female patient lying in bed,  comfortable, conversational, cooperative with exam.   PSYCHIATRIC:  Alert and oriented x 3, pleasant.  HEENT:  Atraumatic, normocephalic.  Oral mucosa moist and pink.  Pallor positive.  No icterus.  Pupils bilaterally equal and reactive to light.  No oral thrush.  NECK:  Supple.  No thyromegaly or palpable lymph nodes.  Trachea midline.  No carotid bruit, JVD.  CARDIOVASCULAR:  S1 and S2 with a systolic murmur.  Peripheral pulses 2+, has 1+ edema.  RESPIRATORY:  Normal work of breathing.  Clear to auscultation on both sides.  Has right-sided chest wall tenderness.  GASTROINTESTINAL:  Soft abdomen, nontender.  Bowel sounds present.  No hepatosplenomegaly palpable.  GENITOURINARY:  No CVA tenderness or bladder distention.  SKIN:  Warm and dry.  No petechiae, rash, ulcers.  MUSCULOSKELETAL:  No joint swelling, redness of the large joints.  Normal muscle tone.  NEUROLOGICAL:  Motor strength five out of five in upper and lower extremities.  Sensation is intact all over.  LYMPHATIC:  No cervical lymphadenopathy.   LABORATORY STUDIES:  Show glucose 69, BUN 72, creatinine 9.4, sodium 137, potassium 3.7, chloride 101, bicarb 22, magnesium of 1.4, lipase 122.   AST, ALT, alkaline phosphatase, bilirubin normal.  Albumin 1.9.    WBC 8.5, hemoglobin 10.5, platelets of 131, MCV of 84.  Chest x-ray PA and lateral shows cardiomegaly with possible edema versus pneumonitis.  Does have persistent focal patchy mass-like opacity in the right lower lobe.  CT chest is suggested for further evaluation.   ASSESSMENT AND PLAN: 1.  Right lower lobe pneumonia with possible bilateral pneumonitis in a patient with nausea, vomiting, possible aspiration, but the patient has been on dialysis and also recent hospital stay.  We will treat her for healthcare-acquired pneumonia and aspiration pneumonia with vancomycin, Zosyn and azithromycin.  We will get sputum and blood cultures.  The patient continues to be febrile, tachycardic,  but does not have sepsis.  No acute respiratory failure.  2.  Nausea, vomiting, dysphagia, and diarrhea.  The patient had an esophageal stricture on recent barium swallow, was supposed to get an endoscopy as outpatient, but this persists and her nausea, vomiting has returned.  We will consult gastroenterology for an endoscopy inpatient.  The patient has had problems with hypoglycemia with skipping meals.  We will need D10 drip at 40 mL per hour if she does get nothing by mouth.  With her diarrhea, we will check Clostridium difficile with her recent hospital stay and stool WBC.  No antibiotics at this time.  3.  End-stage renal disease on hemodialysis.  The patient will get dialysis later today.  Today is her scheduled dialysis day.  4.  Hypertension.  Hold Coreg secondary to borderline low blood pressures.  5.  Anemia of chronic disease,  stable.  6.  Deep vein thrombosis prophylaxis with heparin.  7.  CODE STATUS:  FULL CODE.   Time spent today on this case was 45 minutes.    ____________________________ Molinda Bailiff Afsheen Antony, MD srs:ea D: 10/15/2013 12:57:37 ET T: 10/15/2013 16:30:47 ET JOB#: [id number]  cc: Wardell Heath R. Lovetta Condie, MD, <Dictator> Serita Sheller. Maryellen Pile, MD Lennox Pippins, MD Ezzard Standing. Bluford Kaufmann, MD Orie Fisherman MD ELECTRONICALLY SIGNED 10/20/2013 16:40

## 2014-09-02 NOTE — Discharge Summary (Signed)
PATIENT NAME:  Veronica Gordon, Veronica Gordon MR#:  [id number] DATE OF BIRTH:  04/07/1966  DATE OF ADMISSION:  07/19/2013 DATE OF DISCHARGE:  07/22/2013  PRIMARY CARE PHYSICIAN:  Dr. Maryellen PileEason.   CARDIOLOGIST: Dr. Welton FlakesKhan.   DIALYSIS CENTER:  [facility] [address]   FINAL DIAGNOSES: 1.  Rapid atrial fibrillation.  2.  Nausea, vomiting, likely viral gastroenteritis.  3.  End-stage renal disease and hyperkalemia.  4.  Anxiety and depression.  5.  Pruritus.    5.  History of hypertension.   MEDICATIONS ON DISCHARGE: Include Requip 1 mg at bedtime, trazodone 100 mg at bedtime, Wellbutrin XL 300 mg at bedtime, Cymbalta 60 mg at bedtime, lorazepam 1 mg 1 tablet daily, Renagel 800 mg 2 tablets twice a day, Sensipar 60 mg daily, gabapentin 600 mg at bedtime, Zofran 8 mg once a day as needed, Coreg 12.5 mg at bedtime, ferrous sulfate 325 mg twice a day with meals, PhosLo Gelcaps 667 mg 2 capsules twice a day, amiodarone 200 mg daily, aspirin 325 mg daily, hydroxyzine hydrochloride 10 mg 1 tablet 3 times a day as needed for itching. Stop clonidine. Stop hydralazine.   DIET: Renal diet, regular consistency.   ACTIVITY: As tolerated.   Follow up with Dr. Welton FlakesKhan, cardiology, 1 week; [facility] [address] for dialysis on Saturday, Tuesday and Thursday. Follow up in 1 to 2 weeks with Dr. Maryellen PileEason.   HOSPITAL COURSE: The patient was admitted 07/19/2013, discharged 07/22/2013. Came in with atrial fibrillation, rapid ventricular response. The patient was admitted to the CCU initially on Cardizem drip, ended up being switched over to amiodarone drip secondary to hypotension. For a history of hypertension, her blood pressure was on the lower side. Medications needed to be held.   LABORATORY AND RADIOLOGICAL DATA DURING THE HOSPITAL COURSE: Included an EKG that showed atrial fibrillation, rapid ventricular response, nonspecific ST-T wave changes. Troponin borderline at 0.12. TSH 0.78. White blood cell count 5.5, H and H 11.9 and  36.0, platelet count of 156. Glucose 78, BUN 34, creatinine 5.64, sodium 137, potassium 4.0, chloride 100, CO2 of 29, calcium 9.3. LIVER FUNCTION TESTS: Total protein slightly high at 8.5, albumin slightly low at 2.9. CHEST X-RAY: Congestive heart failure seen on x-ray. Next troponin borderline at 0.14, next troponin borderline at 0.17. LDL 38, HDL 42, triglycerides 59. Hemoglobin A1c 4.6. Echocardiogram showed an EF of 30% to 35%. No thrombi seen. Dilated left and right atrium, severe tricuspid regurgitation, moderate mitral valve regurgitation. PROTEIN ELECTROPHORESIS: Gammaglobulin high at 1.8, albumin low at 2.9. FREE KAPPA AND LAMBDA LIGHT CHAINS:  Included free kappa light chain elevated at 915. Free lambda light chain elevated at 365. Kappa/lambda ratio elevated at 2.5. Phosphorus  upon discharge 5.9. Potassium 4.6.   HOSPITAL COURSE PER PROBLEM LIST:  1. For the patient's rapid atrial fibrillation, the patient initially was started on IV Cardizem in the CCU, switched over to IV amiodarone secondary to hypotension. The patient converted to normal sinus rhythm and then switched over to amiodarone p.o. Sent home on amiodarone 200 mg orally. The patient was also changed on her Coreg dose to 12.5 mg at bedtime. Aspirin only for anticoagulation, as per Dr. Welton FlakesKhan. Follow up with Dr. Welton FlakesKhan in 1 week as an outpatient.  2. Nausea and vomiting, likely a viral gastroenteritis because a family member had this. This had settled down. No further vomiting. The patient was tolerating diet.  3.  End-stage renal disease and hyperkalemia up to 5.3 during the hospital course. The  patient was dialyzed during the hospital course and will be kept on her usual schedule and will go to dialysis as outpatient.  4.  Anxiety/depression. No changes in anxiety medications were made.  5.  Pruritus.  I did put the patient on p.r.n. hydroxyzine.  6.  History of hypertension. Blood pressure was on the lower side here. I did stop the  patient's clonidine and the patient's hydralazine. I did keep the patient on Coreg, but changed it to once a day dosing. Blood pressure her during the entire hospital course was low; started to come up and was normal upon discharge, 138/76.   TIME SPENT ON DISCHARGE: 35 minutes.   ____________________________ Herschell Dimes. Renae Gloss, MD rjw:dmm D: 07/22/2013 15:11:01 ET T: 07/22/2013 22:56:00 ET JOB#: [id number]  cc: Herschell Dimes. Renae Gloss, MD, <Dictator> Serita Sheller. Maryellen Pile, MD Laurier Nancy, MD [facility] [place] Salley Scarlet MD ELECTRONICALLY SIGNED 07/30/2013 15:13

## 2014-09-02 NOTE — Consult Note (Signed)
Chief Complaint:  Subjective/Chief Complaint Pt with persistent diarrhea.  Continued odynophagia & dysphagia feels like food "sticks in midesophagus".  Denies abdominal pain.  Good appetite.   VITAL SIGNS/ANCILLARY NOTES: **Vital Signs.:   25-Jun-15 09:36  Temperature Temperature (F) 98  Celsius 36.6  Pulse Pulse 111  Respirations Respirations 18  Systolic BP Systolic BP 122  Diastolic BP (mmHg) Diastolic BP (mmHg) 72  Mean BP 88  Pulse Ox % Pulse Ox % 96  Oxygen Delivery 2L   Brief Assessment:  GEN well developed, well nourished, no acute distress, A/Ox3. Pt in dialysis   Cardiac Regular   Respiratory normal resp effort   Gastrointestinal Normal   Gastrointestinal details normal Soft  Nondistended  Bowel sounds normal  No gaurding  No rigidity  +incontinent of light brown stool   EXTR negative cyanosis/clubbing, negative edema   Additional Physical Exam Skin: warm, dry, intact   Lab Results: Routine Chem:  25-Jun-15 05:05   Result Comment LABS - This specimen was collected through an   - indwelling catheter or arterial line.  - A minimum of 5mls of blood was wasted prior    - to collecting the sample.  Interpret  - results with caution. CBC - SMEAR SCANNED  Result(s) reported on25 Jun 2015 at 07:35AM.  Routine Hem:  25-Jun-15 05:05   WBC (CBC) 5.5  RBC (CBC)  3.01  Hemoglobin (CBC)  7.8  Hematocrit (CBC)  25.9  Platelet Count (CBC)  72  MCV 86  MCH  25.9  MCHC  30.1  RDW  20.9  Neutrophil % 77.7  Lymphocyte % 12.8  Monocyte % 7.9  Eosinophil % 0.2  Basophil % 1.4  Neutrophil # 4.3  Lymphocyte #  0.7  Monocyte # 0.4  Eosinophil # 0.0  Basophil # 0.1   Assessment/Plan:  Assessment/Plan:  Assessment Diarrhea: Recurrent & persistent likely due to immunocompetent state. 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  Unfortunately pt is not a candidate for EGD at this time although her respiratory status has  improved significantly, she has increased risk of bleeding due to thrombocytopenia as she will likely need esophageal dilation & biopsies.  EGD with Dr Servando SnareWohl once platelets greater than 100.   Plan 1) Continue Supportive measures 2) EGD in near future with improved platelet function over 100 3) Continue PPI 4) Continue flagyl, bentyl, probiotics, & PRN imodium Please call if you have any questions or concerns   Electronic Signatures: Joselyn ArrowJones, Kandice L (NP)  (Signed 25-Jun-15 12:58)  Authored: Chief Complaint, VITAL SIGNS/ANCILLARY NOTES, Brief Assessment, Lab Results, Assessment/Plan   Last Updated: 25-Jun-15 12:58 by Joselyn ArrowJones, Kandice L (NP)

## 2014-09-02 NOTE — Consult Note (Signed)
Chief Complaint:  Subjective/Chief Complaint seen for anemia-continues with central abdominal pain/discomfort.  no emesis, diarrhea improving.   VITAL SIGNS/ANCILLARY NOTES: **Vital Signs.:   24-Jul-15 16:18  Vital Signs Type Q 4hr  Temperature Temperature (F) 98.5  Celsius 36.9  Pulse Pulse 100  Respirations Respirations 18  Systolic BP Systolic BP 989  Diastolic BP (mmHg) Diastolic BP (mmHg) 78  Mean BP 91  Pulse Ox % Pulse Ox % 92  Pulse Ox Activity Level  At rest  Oxygen Delivery 2L  *Intake and Output.:   24-Jul-15 01:36  Stool  small loose stool    04:09  Stool  loose small stool    08:13  Stool  Small loose nonformed light brown bm.    10:28  Stool  small yellow soft smear   Brief Assessment:  Cardiac Regular   Respiratory clear BS   Gastrointestinal details normal Soft  Nondistended  No rebound tenderness  positive bowel sounds, tender to palpation periumbilical.   Lab Results: Routine BB:  23-Jul-15 10:51   Direct Coombs, Polyspecific Negative (Result(s) reported on 01 Dec 2013 at 01:30PM.)  Routine Micro:  23-Jul-15 06:45   Micro Text Report STOOL COMPREHENSIVE   COMMENT                   NO SALMONELLA OR SHIGELLA ISOLATED   COMMENT                   NO PATHOGENIC E.COLI DETECTED   COMMENT                   NO CAMPYLOBACTER ANTIGEN DETECTED   ANTIBIOTIC                        Micro Text Report CLOSTRIDIUM DIFFICILE   C.DIFFICILE ANTIGEN       C.DIFFICILE GDH ANTIGEN : NEGATIVE   C.DIFFICILE TOXIN A/B     C.DIFFICILE TOXINS A AND B : NEGATIVE   INTERPRETATION            Negative for C. difficile.    ANTIBIOTIC                        Culture Comment NO SALMONELLA OR SHIGELLA ISOLATED  Culture Comment . NO PATHOGENIC E.COLI DETECTED  Culture Comment    . NO CAMPYLOBACTER ANTIGEN DETECTED  Result(s) reported on 02 Dec 2013 at 11:54AM.  Routine Chem:  24-Jul-15 05:17   Result Comment LABS - This specimen was collected through an   - indwelling  catheter or arterial line.  - A minimum of 42ms of blood was wasted prior    - to collecting the sample.  Interpret  - results with caution.  Result(s) reported on 02 Dec 2013 at 06:30AM.  Glucose, Serum 85  BUN 12  Creatinine (comp)  2.90  Sodium, Serum 140  Potassium, Serum  3.2  Chloride, Serum 103  CO2, Serum 30  Calcium (Total), Serum  7.1  Anion Gap 7  Osmolality (calc) 278  eGFR (African American)  21  eGFR (Non-African American)  18 (eGFR values <662mmin/1.73 m2 may be an indication of chronic kidney disease (CKD). Calculated eGFR is useful in patients with stable renal function. The eGFR calculation will not be reliable in acutely ill patients when serum creatinine is changing rapidly. It is not useful in  patients on dialysis. The eGFR calculation may not be applicable to patients at the low  and high extremes of body sizes, pregnant women, and vegetarians.)  Routine Sero:  22-Jul-15 18:12   Occult Blood, Feces NEGATIVE (Result(s) reported on 30 Nov 2013 at 06:43PM.)  Routine Hem:  21-Jul-15 17:56   WBC (CBC)  3.0  Hemoglobin (CBC)  4.5  Platelet Count (CBC)  82  22-Jul-15 02:55   WBC (CBC)  3.2  Hemoglobin (CBC)  7.0  Platelet Count (CBC)  71 (Result(s) reported on 30 Nov 2013 at 09:15AM.)  23-Jul-15 04:38   WBC (CBC)  2.4  Hemoglobin (CBC)  5.7  Platelet Count (CBC)  68 (Result(s) reported on 01 Dec 2013 at 07:32AM.)  24-Jul-15 05:17   WBC (CBC) 4.1  RBC (CBC)  2.45  Hemoglobin (CBC)  7.0  Hematocrit (CBC)  21.1  Platelet Count (CBC)  70  MCV 86  MCH 28.4  MCHC 33.0  RDW  16.8  Bands 20  Segmented Neutrophils 65  Lymphocytes 8  Monocytes 2  Metamyelocyte 3  Myelocyte 2  Diff Comment 1 ANISOCYTOSIS  Diff Comment 2 POIKILOCYTOSIS  Diff Comment 3 HYPOCHROMIA  Diff Comment 4 TARGET CELLS  Diff Comment 5 ELLIPTOCYTES  Diff Comment 6 PLTS VARIED IN SIZE  Result(s) reported on 02 Dec 2013 at 06:30AM.   Assessment/Plan:  Assessment/Plan:   Assessment 1) anemia-multifactorial, likely related to MAI, heme negative.  2) HIV-no treatment as o/p.  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  cultures negative, o+p still pending.  4) occasional cervical odynophagia 5) malnutrition-palliative care note seen, possible need for PEG.   Plan 1) will recheck lfts, consider [place] 2) awaiting for patient to discuss PEG with family further. Dr Vira Agar is on call this weekend if needed.   Electronic Signatures: Loistine Simas (MD)  (Signed 24-Jul-15 16:48)  Authored: Chief Complaint, VITAL SIGNS/ANCILLARY NOTES, Brief Assessment, Lab Results, Assessment/Plan   Last Updated: 24-Jul-15 16:48 by Loistine Simas (MD)

## 2014-09-02 NOTE — Consult Note (Signed)
Psychiatry: Follow-up consult note for this 49 year old woman with multiple severe medical problems.  Consult was reordered today for depression.  I have already been following this patient during this hospital stay but came by to get in to see her today.  Patient told me that she felt that she was not doing well.  When I ask her to be more clear she was not able to really specify.  She specifically denies having any pain.  Says she is a little bit sick to her stomach.  Minimizes other physical symptoms.  Emotionally she says she feels she is doing okay.  Denies suicidal ideation.  Denies being aware of any hallucinations. was neatly groomed and awake with a visitor in her room.  She made intermittent eye contact with me.  Psychomotor activity was limited.  Speech quieted and slow.  Affect blunted mood stated as okay.  Denies suicidal ideation.  Denies hallucinations.  At the same time she is clearly intermittently confused.  Some of her speech still wanders off into things that make no sense.  She was convinced that there was another relative in her room who was not there.  She could not tell me exactly where she was or what year it was.  She got frustrated when I was asking cognitive questions and asked to stop the interview. with end-stage renal failure, AIDS, multiple medical problems.  Has had symptoms of depression but more recently has appeared to be delirious.  toDay she seems better than she was when I saw her last time.  The last time I saw her she wasn't even able to engage in a conversation. is to increase mirtazapine to 45 mg at night for more complete antidepressant effect.  Continue Abilify 5 mg once a day.  I note that she has been started on 5 mg of methylphenidate a day.  I think that's fine and not likely to cause any problems. and educational counseling briefly done.  Continue to follow up. NOS largely related to medical condition.  Delirium caused by multiple medical problems.  Electronic  Signatures: Audery Amellapacs, Jalexis Breed T (MD)  (Signed on 08-Jul-15 20:35)  Authored  Last Updated: 08-Jul-15 20:35 by Audery Amellapacs, Sherrita Riederer T (MD)

## 2014-09-02 NOTE — Discharge Summary (Signed)
PATIENT NAME:  Veronica Gordon, Veronica Gordon MR#:  [id number] DATE OF BIRTH:  1966/04/22  DATE OF ADMISSION:  11/29/2013 DATE OF DISCHARGE:  12/08/2013  DISCHARGE DIAGNOSES:  1.  Recurrent hypoglycemia due to poor p.o. intake and nutrition on continuous dextrose infusion.  2.  Acute on chronic anemia status post total 4 units of blood transfusion. Hemoglobin on the date of discharge 7.6.  The patient is getting her 4th unit today before discharge.  3.  No active bleeding.  4.  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  5.  HIV on HAART.  6.  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  7.  Severe malnutrition due to poor p.o. intake on Megace and Remeron.   SECONDARY DIAGNOSES:  1.  End-stage renal disease on hemodialysis.  2.  AIDS.  3.  Anemia of chronic disease.  4.  Hypertension.  5.  Esophageal stricture.  6.  Paroxysmal atrial fibrillation, history of methicillin-resistant Staphylococcus aureus.  7.  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  8.  Anxiety and depression.  9.  Recurrent hypoglycemia.  10. Severe protein-calorie malnutrition.  11. Thrombocytopenia.   CONSULTATIONS:  GI, Dr. Barnetta Chapel; infectious disease, Dr. Clydie Braun; nephrology, Dr. Mady Haagensen; palliative care, Dr. Harriett Sine Phifer; oncology, Dr. Benita Gutter; endocrinology, Dr. Carlena Sax.   PROCEDURES AND RADIOLOGY:  Chest x-ray on the July 21 showed low lung volumes with persistent bilateral mid and lower lung airspace disease.    MAJOR LABORATORY PANEL: Blood cultures x2 were negative on admission and July 21. MRSA screen was negative on the July 22. Stool culture was negative including C. difficile.   Stool for Hemoccult was negative on July 22.   Giardia antigen was negative.   Serum parvovirus B19 was negative. Haptoglobin was low with a value of 29.   Serum eritropoetin was elevated with a value of 442.   HIV PCR showed less than 20 copies  on HIV-1 RNA PCR. Serum cortisol level was elevated with a value of 20.0, close to normal.   Repeat serum cortisol was 24.8 on July 28.   HISTORY AND SHORT HOSPITAL COURSE:  The patient is a 49 year old female with the above-mentioned medical problems who was admitted for weakness and anemia. This was thought to be due to have acute on chronic anemia, likely anemia of chronic disease. Please see Dr. Camillia Herter dictated history and physical for further details. The patient was evaluated by GI, Dr. Barnetta Chapel, who did not recommend any further luminal intervention considering recent EGD which was performed on July 2 showing esophageal ulcers. The patient was continued on PPI, received a total of about 3 units of blood during the course of the hospitalization, the 4th unit on the date of discharge with no active signs of bleeding or any obvious blood. Her Hemoccult stool was negative.   Oncology consultation was obtained with Dr. Lorre Nick who recommended continuing treatment of her HIV and Mycobacterium kansasii as her counts/pancytopenia was thought to be due to underlying bone marrow suppression.  Infectious disease consultation was obtained with Dr. Clydie Braun who recommended continuing her anti-HIV medications (HAART) especially if her count was less than 20. He also recommended continuing rifampin, INH, ethambutol and clarithromycin for Mycobacterium kansasii infection which was disseminated. The patient was also seen and evaluated by nephrology, Dr. Mosetta Pigeon, considering her end-stage renal disease for dialysis need. The patient was having significant difficulty with getting her nutrition through dietary intake, as she was not  able to eat much due to poor appetite.  Palliative care consultation was obtained with Dr. Harriett Sine Phifer, who discussed all the care options including PEG tube of Dobbhoff. The patient refused all of those and wanted to keep only oral intake. She also wanted to be full code.    At this point, the patient does have issues with controlling her blood sugar and has been dropping her sugar anywhere from the 40s to the 90s requiring continuous D10 infusion. She also has significant edema, likely from her severe protein-calorie malnutrition and has been placed on Megace and Remeron. After discussion with family and the patient's family physician, Dr. Dorothey Baseman, the decision was made to transfer her back to her facility at [facility] where they can continue her drip and other treatment. The patient does remain at very high risk for readmission although she does not have any long-term acute care benefits. This will be her best option at this time. She was much more alert and interactive this morning.   DISCHARGE VITAL SIGNS: Temperature 97.6, heart rate 97 per minute, respirations 18 per minute, blood pressure 115/80 mmHg, she was saturating 95% on room air.   PERTINENT PHYSICAL EXAMINATION ON THE DATE OF DISCHARGE:  CARDIOVASCULAR: S1, S2 normal. No murmurs, rubs, or gallop.  LUNGS: Clear to auscultation bilaterally. No wheezing, rales, rhonchi, or crepitation.  ABDOMEN: Soft, benign. She has a left upper arm AV fistula, which is working fine. She also has a right IJ central line in place which was already there from the facility when she was admitted here. She does have anasarca, likely from severe protein-calorie malnutrition.  NEUROLOGIC: She is much more alert and interactive, but she does have severe generalized weakness, nonfocal.   All other physical examination remained at baseline.   DISCHARGE MEDICATIONS:  1.  Trazodone 100 mg p.o. at bedtime.  2.  Gabapentin 600 mg p.o. at bedtime.  3.  Zofran 8 mg p.o. daily.  4.  PhosLo gelcaps 667 mg 2 capsules p.o. b.i.d.  5.  Nystatin 5 mL p.o. every 6 hours.  6.  Aspirin 81 mg p.o. daily.  7.  Acetaminophen 650 mg p.o. every 4 hours as needed.  8.  Mirtazapine 15 mg p.o. at bedtime.  9.  Loperamide 2 mg p.o. 4  times a day as needed.  10. Palonosetron 30 mL p.o. every 4 hours as needed.  11. Megace 10 mL p.o. b.i.d.  12. Aripiprazole 5 mg p.o. daily.  13. Ropinirole 1 mg p.o. at bedtime.  14. Ethambutol 400 mg 3 tablets p.o. Tuesday, Thursday, Saturday.  15. Lamivudine 10 mg/mL 2.5 mL every 24 hours.  16. Benzonatate 100 mg capsule every 4 hours as needed.  17. Tenofovir 300 mg p.o. daily.  18. Raltegravir 400 mg p.o. b.i.d.  19. Clarithromycin 500 mg p.o. b.i.d.  20. Protonix 40 mg p.o. daily.  21. Nicotine 10 mg inhalation, as she was using before.  22. Calcium with vitamin D 1 tablet p.o. b.i.d.  23. Ensure Plus with each meal,  24. Glucagon 1 mg injectable as needed for low blood sugar, as per her primary physician at the facility.  25. 10% D/0.5% normal saline solution at 50 mL per hour, continuous infusion, as before.  26. Dexamethasone 4 mg p.o. b.i.d.  27. Isoniazid 300 mg p.o. daily.  28. Rifampin 300 mg 2 capsules p.o. daily.   DISCHARGE DIET: Renal/dialysis diet.   DISCHARGE ACTIVITY: As tolerated.   DISCHARGE INSTRUCTIONS AND FOLLOWUP: The patient  was instructed to follow up with her primary care physician, Dr. Dorothey Basemanavid Bronstein, at [facility] in 1-2 days. She will need followup with Dr. Clydie Braunavid Fitzgerald in 1-2 weeks [facility]. She will need followup with Dr. Barnetta ChapelMartin Skulskie, [facility] in 2-4 weeks. She was also instructed [facility], Dr. Tedd SiasSolum. She will get hemodialysis as scheduled. She is scheduled to get physical therapy evaluation and management while at the facility. I did discuss her case and discharge planning with her primary care physician, Dr. Dorothey Basemanavid Bronstein,  and her family who are all in agreement. She was instructed to get a central line changed about every 30 days and this was conveyed to her primary care physician at the facility. She remains at very high risk for readmission. She will have a  very poor prognosis, although she is full code at this time.    ____________________________ Ellamae SiaVipul S. Sherryll BurgerShah, MD vss:lt D: 12/08/2013 15:04:00 ET T: 12/08/2013 15:27:00 ET JOB#: [id number]  cc: Teena Iraniavid M. Terance HartBronstein, MD Ned GraceNancy Phifer, MD Stann Mainlandavid P. Sampson GoonFitzgerald, MD Mosetta PigeonHarmeet Singh, MD Christena DeemMartin U. Skulskie, MD Rembert Browe S. Sherryll BurgerShah, MD, <Dictator> Patricia PesaVIPUL S Irena Gaydos MD ELECTRONICALLY SIGNED 12/08/2013 19:51

## 2014-09-02 NOTE — Consult Note (Signed)
Chief Complaint:  Subjective/Chief Complaint Pt states diarrhea improved stool more formed.  Also reports dysphagia better & ate a sausage biscuit frrom [place] this AM.  Denies abdominal pain.  Good appetite.   VITAL SIGNS/ANCILLARY NOTES: **Vital Signs.:   29-Jun-15 01:07  Respirations Respirations 18    08:15  Temperature Temperature (F) 97  Celsius 36.1  Pulse Pulse 93  Respirations Respirations 18  Systolic BP Systolic BP 161  Diastolic BP (mmHg) Diastolic BP (mmHg) 85  Mean BP 99  Pulse Ox % Pulse Ox % 94  Pulse Ox Activity Level  At rest  Oxygen Delivery Room Air/ 21 %   Brief Assessment:  GEN well developed, well nourished, no acute distress, A/Ox3. Sister & sister in law at bedside.   Cardiac Regular   Respiratory normal resp effort   Gastrointestinal Normal   Gastrointestinal details normal Soft  Nondistended  Bowel sounds normal  No gaurding  No rigidity   EXTR negative cyanosis/clubbing, negative edema   Additional Physical Exam Skin: warm, dry, intact   Lab Results: Routine Chem:  29-Jun-15 04:21   Glucose, Serum  44  BUN  20  Creatinine (comp)  4.32  Potassium, Serum 3.7  Chloride, Serum 104  CO2, Serum  33  Calcium (Total), Serum  6.6  Anion Gap  5  Osmolality (calc) 283  eGFR (African American)  13  eGFR (Non-African American)  11 (eGFR values <45m/min/1.73 m2 may be an indication of chronic kidney disease (CKD). Calculated eGFR is useful in patients with stable renal function. The eGFR calculation will not be reliable in acutely ill patients when serum creatinine is changing rapidly. It is not useful in  patients on dialysis. The eGFR calculation may not be applicable to patients at the low and high extremes of body sizes, pregnant women, and vegetarians.)  Result Comment CALCIUM - NOTIFIED OF CRITICAL VALUE  - RESULTS VERIFIED BY REPEAT TESTING.  - CALLED TO BROOKE ROBERTSON AT 0500 ON  - 11/07/2013..Marland KitchenMarland KitchenPL  - READ-BACK PROCESS  PERFORMED.  Result(s) reported on 07 Nov 2013 at 04:58AM.  Result Comment LABS - This specimen was collected through an   - indwelling catheter or arterial line.  - A minimum of 552m of blood was wasted prior    - to collecting the sample.  Interpret  - results with caution.  Result(s) reported on 07 Nov 2013 at 05:16AM.  Routine Hem:  29-Jun-15 04:21   WBC (CBC) 5.8  RBC (CBC)  3.19  Hemoglobin (CBC)  8.6  Hematocrit (CBC)  27.8  Platelet Count (CBC)  102  MCV 87  MCH 27.1  MCHC  31.1  RDW  22.5  Neutrophil % 78.0  Lymphocyte % 11.6  Monocyte % 9.2  Eosinophil % 0.2  Basophil % 1.0  Neutrophil # 4.5  Lymphocyte #  0.7  Monocyte # 0.5  Eosinophil # 0.0  Basophil # 0.1   Assessment/Plan:  Assessment/Plan:  Assessment Diarrhea: Improving on current regimen.  Likely due to immunocompetent state.  [facility]. 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  [facility]  Plan 1) Continue Supportive measures 2) EGD/colonoscopy Wednesday 3) Continue PPI 4) Continue flagyl, bentyl, probiotics, & PRN imodium 5) CBC Wed AM Please call if you have any questions or concerns   Electronic Signatures: JoAndria MeuseNP)  (Signed 29-Jun-15 11:50)  Authored: Chief Complaint, VITAL SIGNS/ANCILLARY NOTES, Brief Assessment, Lab Results,  Assessment/Plan   Last Updated: 29-Jun-15 11:50 by Andria Meuse (NP)

## 2014-09-02 NOTE — Consult Note (Signed)
Chief Complaint and History:  Referring Physician Dr. Sampson GoonFitzgerald   Chief Complaint Hypoglycemia   Allergies:  No Known Allergies:   Assessment/Plan:  Assessment/Plan 49 yo F seen in consultation for hypoglycemia. She is known to me from a recent admission when I was also consulted for hypoglycemia. She has multiple emdical problems including AIDS, ESRD, severe malnutrition, and encephalopathy. She has been challenging to feed. She is a difficult historian. Sugars have been low and into the 40s on fingerstick and into the 50s on venous blood draws. She is currently on D10 at 125 cc/hr with BG in the 80-100 range.   There is a question of adrenal insufficiency, however she has had repeatedly nml cortisol levels.  A/ Hypoglycemia attributed to poor glucose stores, anorexia, malnutrition  P/ Continue to encourage good nutrition. Spoke with nursing and they will have her hand fed at mealtimes. She is on Ensure supplements, but often refuses them. Continue Megace to stimulate appetite. Consider stopping Ritalin as this is an appetite suppressant.   No e/o adrenal insufficiency and no role for glucocorticoids.  Reduce IV dextrose as she has e/o fluid overload with diffuse edema. Will dec rate to 90 cc/hr and titrate down, as tol. Aim to keep FSBS in the 60+ range.   Dictated consult to follow.   Electronic Signatures: Raj JanusSolum, Wenceslao Loper M (MD)  (Signed 27-Jul-15 18:00)  Authored: Chief Complaint and History, ALLERGIES, Assessment/Plan   Last Updated: 27-Jul-15 18:00 by Raj JanusSolum, Charmon Thorson M (MD)

## 2014-09-02 NOTE — Consult Note (Signed)
Chief Complaint:  Subjective/Chief Complaint Pt states did not complete prep.  Platelets 81.  Denies abdominal pain, nausea or vomiting.   VITAL SIGNS/ANCILLARY NOTES: **Vital Signs.:   01-Jul-15 04:48  Vital Signs Type Routine  Temperature Temperature (F) 98.4  Celsius 36.8  Temperature Source oral  Pulse Pulse 101  Respirations Respirations 18  Systolic BP Systolic BP 833  Diastolic BP (mmHg) Diastolic BP (mmHg) 83  Mean BP 99  Pulse Ox % Pulse Ox % 91  Pulse Ox Activity Level  At rest  Oxygen Delivery Room Air/ 21 %   Brief Assessment:  GEN well developed, well nourished, no acute distress, A/Ox3, sister at bedside   Cardiac Regular   Respiratory normal resp effort   Gastrointestinal Normal   Gastrointestinal details normal Soft  Nondistended  Bowel sounds normal  No gaurding  No rigidity   EXTR negative cyanosis/clubbing, negative edema   Additional Physical Exam Skin: warm, dry, intact   Lab Results: Routine BB:  01-Jul-15 03:50   ABO Group + Rh Type -    09:46   Platelets (Blood Component) Ready (Result(s) reported on 09 Nov 2013 at 10:03AM.)  Routine Chem:  01-Jul-15 03:50   Result Comment GROUP AND RH - TEST NOT NEEDED.-PBP  Result(s) reported on 09 Nov 2013 at 09:59AM.  Result Comment CALCIUM - NOTIFIED OF CRITICAL VALUE  - RESULTS VERIFIED BY REPEAT TESTING.  - CALLED TO PATTY POWELL AT 0426 ON  - 11/09/2013.Marland KitchenMarland KitchenTPL  - READ-BACK PROCESS PERFORMED.  Result(s) reported on 09 Nov 2013 at 04:42AM.  Glucose, Serum 74  BUN 14  Sodium, Serum 139  Potassium, Serum 3.6  Chloride, Serum 101  CO2, Serum  34  Calcium (Total), Serum  6.5  Anion Gap  4  Osmolality (calc) 277  eGFR (African American)  18  eGFR (Non-African American)  16 (eGFR values <24m/min/1.73 m2 may be an indication of chronic kidney disease (CKD). Calculated eGFR is useful in patients with stable renal function. The eGFR calculation will not be reliable in acutely ill patients when serum  creatinine is changing rapidly. It is not useful in  patients on dialysis. The eGFR calculation may not be applicable to patients at the low and high extremes of body sizes, pregnant women, and vegetarians.)  Routine Hem:  01-Jul-15 03:50   WBC (CBC) 5.2  RBC (CBC)  3.24  Hemoglobin (CBC)  8.9  Hematocrit (CBC)  28.7  Platelet Count (CBC)  81 (Result(s) reported on 09 Nov 2013 at 04:42AM.)  MCV 89  MCH 27.4  MCHC  30.9  RDW  21.6  Bands 6  Segmented Neutrophils 68  Lymphocytes 16  Monocytes 9  Basophil 1  NRBC 1  Diff Comment 1 ANISOCYTOSIS  Diff Comment 2 POIKILOCYTOSIS  Diff Comment 3 POLYCHROMASIA  Diff Comment 4 HYPOCHROMIA  Diff Comment 5 TARGET CELLS  Diff Comment 6 LARGE PLATELETS  Diff Comment 7 PLTS VARIED IN SIZE  Result(s) reported on 09 Nov 2013 at 04:42AM.   Assessment/Plan:  Assessment/Plan:  Assessment Diarrhea: Prep today for colonoscopy tomorrow.  Unfortunately, colonoscopy & EGD postponed today due to need for biopsies for CMV & other infectious colitis as well as esophageal stricture dilation given increased risk of bleeding due to thrombocytopenia.  I have discussed her difficult situation with the patient, Dr FOla Spurr Dr MBenjie Karvonen& Dr WAllen Norris  She has viremia for CMV but Dr FOla Spurrwould like biopsy or at minimum visualization of typical CMV endoscpic ulcerations prior to treatment due to high  risk medication.  Dr Ola Spurr ordered platelets & will recheck CBC in AM with hopes to proceed tomorrow.  Pt encouraged to complete as much of prep as possible until clear. 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  Esophaeal stricture.  Plan for EGD with dilation tomorrow if platelets over 100.   Plan 1) Continue Supportive measures 2) EGD/colonoscopy tomorrow if platelets over 100 3) Agree with platelet transfusion  Please call if you have any questions or concerns   Electronic Signatures: Andria Meuse (NP)   (Signed 01-Jul-15 11:50)  Authored: Chief Complaint, VITAL SIGNS/ANCILLARY NOTES, Brief Assessment, Lab Results, Assessment/Plan   Last Updated: 01-Jul-15 11:50 by Andria Meuse (NP)

## 2014-09-02 NOTE — Consult Note (Signed)
PATIENT NAME:  Veronica Gordon, Veronica Gordon MR#:  [id number] DATE OF BIRTH:  05-Aug-1965  INFECTIOUS DISEASE CONSULTATION NOTE  DATE OF CONSULTATION:  11/30/2013  REFERRING PHYSICIAN:      Sital P. Benjie Karvonen, MD  CONSULTING PHYSICIAN:  Cheral Marker. Ola Spurr, MD  REASON FOR CONSULTATION: Human immunodeficiency virus, acquired immune syndrome, mycobacterial infection, anemia.  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 She was discharged to skilled nursing facility on July 14th. At that time, she was on treatment for presumed MAI as well as started antiretrovirals. She continued to remain quite ill there, but was admitted July 21st when she was found to be quite weak and had a hemoglobin that had decreased to 4. Since discharge, she has not had reports of large amounts bleeding either per rectum or with vomiting. She has continued to eat only a very little and has been very weak. She does continue to get dialysis. She has had pitting edema as well. She also has some confusion per her family members who were in the room.    PAST MEDICAL HISTORY:  1.   End-stage renal disease on hemodialysis for several years.  2.  HIV/AIDS, diagnosed initially in 1999; but was not disclosed and only diagnosed again  during this most recent admission; July of 2015. CD4 of 14. 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  3.  CMV viremia, but no evidence of end organ disease in her eye after evaluation by ophthalmology or in her esophagus per formal biopsy results.  4.   Anemia of  chronic disease.  5.   Esophageal ulcers and duodenitis, diagnosed on EGD on her most recent admission.  6.   Esophageal stricture.  8.   A. fib. 9.   Hypertension.  10.   Prior history of MRSA. 11.   Anxiety and depression. 12.   Hyperglycemia.  13.   Severe malnutrition. 14.   Thrombocytopenia.   PAST SURGICAL HISTORY:  Dialysis fistula.  SOCIAL HISTORY: The patient is married. She is living at a rehab center. Family is involved in her care.  FAMILY HISTORY: Noncontributory.  ALLERGIES:  No known drug allergies.   MEDICATIONS: I have reviewed her admission medications. She has started recently on raltegravir, tenofovir and lamivudine for her antiretroviral and has been on ethambutol and clarithromycin for  mycobacterial infection.   REVIEW OF SYSTEMS: Unable to be obtained.   PHYSICAL EXAMINATION: VITAL SIGNS: Temperature 97.6, pulse 100, blood pressure 120/76, respirations 18%, 99% on 2 liters.  GENERAL: She is frail and ill-appearing.  HEENT: Pupils equal, round, reactive to light and accommodation. Her sclerae are anicteric. Her conjunctivae are quite pale.  OROPHARYNX: Dry, but no evidence of thrush.  NECK: Supple. She has a right IJ in place.  HEART: Tachy, but regular.  LUNGS: Coarse breath sounds bilaterally.  ABDOMEN: Mildly distended, soft, nontender.  EXTREMITIES: There is 2+ pitting edema bilateral lower extremity and in her left upper extremity, she has a functioning AV fistula. NEUROLOGIC: She is quite lethargic. She also appears quite depressed. She initially opened her eyes and started to talk to me, then refused to interact after  some time.   LABORATORY DATA: Labs are reviewed from her most recent admission and from this admission. Renal function July 22 is consistent with end-stage renal disease. White blood count was 3.0, hemoglobin 4.5, platelets 82. Repeat hemoglobin is 7.0. Blood cultures x 2 were negative. MRSA screen was negative. LFTs show very low albumin  at 1.0,  alk phos of 302, AST of 44, ALT of 20.   Chest x-ray showed persistent infiltrates.   Review of prior data showed her genotype to be sensitive to the medications she is on. 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 CMV PCR was elevated at 500,000 at last admission. RPR was negative. Stool studies were negative.   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   She is certainly quite ill. Finding of Mycobacterium kansasii and onset of MAI is actually a worse prognosis, as this can be difficult to treat. Her biggest issue, currently; however, is her severe malnutrition. It is unclear what the etiology of her drop in the hemoglobin is as there does not appear to be active bleeding at this time. However, there are multiple factors that could be associated, especially with profound malnutrition. 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 Her diarrhea also continues to persist and has been resistant to treatment with multiple modalities.    RECOMMENDATIONS: 1.    Continue antiretroviral therapy. 2.   I have added rifampin and INH back to her treatment for the  Mycobacterium kansasii. We will continue the ethambutol and clarithromycin for now.  I have also added pyridoxine.   3.    I will discuss with Dr. Lorenda Cahill further treatment options. .  4.   Transfuse as necessary.  5.  I would not pursue  an aggressive gastrointestinal workup at this time, unless  she has an active profound bleed.  6.   We will check a parvovirus B19 PCR.  7.   I would recommend consultation with palliative care who has seen her during her most recent hospitalization.  8.  For her severe malnutrition, we could consider placement of a Dobbhoff to ensure that she is taking enough nutrition.  9.   For her severe diarrhea, we  could consider use of morphine to help slow it down. I would check a clostridium difficile as well.   Thank you for the consultation. I will be glad to follow with you.    ____________________________ Cheral Marker. Ola Spurr, MD dpf:aj D: 11/30/2013 23:24:58 ET T: 12/01/2013 00:57:40 ET JOB#: [id number]  cc: Cheral Marker. Ola Spurr, MD, <Dictator> Merrit Waugh Ola Spurr MD ELECTRONICALLY SIGNED 12/06/2013 15:17

## 2014-09-02 NOTE — Consult Note (Signed)
Pt just finished dialysis. BG was low. 1/2 amp of D50 given. Anesthesia did not recommend proceeding with EGD today.Therefore, will cancel and reschedule for tomorrow AM. Pt informed. Thanks.  Electronic Signatures: Lutricia Feilh, Lovell Nuttall (MD)  (Signed on 14-May-15 14:48)  Authored  Last Updated: 14-May-15 14:48 by Lutricia Feilh, Retha Bither (MD)

## 2014-09-02 NOTE — Consult Note (Signed)
PATIENT NAME:  Veronica Gordon, Veronica Gordon MR#:  [id number] DATE OF BIRTH:  1965/10/08  DATE OF CONSULTATION:  11/30/2013  REFERRING PHYSICIAN:  Dr. Juliene Pina. CONSULTING PHYSICIAN:  Hardie Shackleton. Colin Benton, PA-C  ATTENDING GASTROENTEROLOGIST:  Dr. Marva Panda.  REASON FOR CONSULTATION: Anemia with possible GI bleed.   HISTORY OF PRESENT ILLNESS: This is a pleasant 49 year old female who was recently discharged earlier in July after month-long hospitalization where she was diagnosed with HIV and MAI. She was discharged to a SNF with a hemoglobin of 8.0 on July 13, and it was recently found to be only 4.5. She was sent back to the hospital for further evaluation of this. The patient has been complaining of excessive fatigue and, when speaking with the patient today, she is not very talkative and is clearly very tiresome. She has denied heartburn, nausea, vomiting, diarrhea, or constipation. She is denying any abdominal pain. She states that she has not seen any overt GI bleeding. Her appetite is okay, and she does feel as though she has been eating fairly well. She was started on IV Protonix 40 mg twice a day. Of note, she has a newly diagnosed HIV and was started on HIV medications. She has been having signs of pancytopenia for the past several weeks. During her last admission, she did have mycobacterium tuberculosis test negative. RPR was negative. Acid fast smear was positive. CMV was actually elevated during her last admission on June 29. Hepatitis B was negative. She has undergone recent endoscopic intervention on 11/10/2013, by Dr. Servando Snare. She did have multiple small esophageal ulcers with no stigmata of recent bleeding, also some duodenitis. Colonoscopy revealed petechiae, but no active bleeding.   PAST MEDICAL HISTORY: HIV, end-stage renal disease on hemodialysis, anemia of chronic disease, history of esophageal ulcers and duodenitis, hypertension, paroxysmal atrial fibrillation, history of MRSA, history of disseminated  MAI, anxiety, depression, malnutrition, pancytopenia, elevated CMV on 11/07/2013.   PAST SURGICAL HISTORY: Fistula for dialysis.   ALLERGIES: NO KNOWN DRUG ALLERGIES.   HOME MEDICATIONS: Requip, tenofovir, trazodone, Zofran, raltegravir, PhosLo, pantoprazole, nystatin, palonosetron, Remeron, methylphenidate, Megace, nicotine, Ativan, loperamide, lamivudine, lidocaine topical, gabapentin, Ensure, dextrose, ethambutol clarithromycin, calcium with vitamin D, dextrose, baby aspirin, Abilify, and Tylenol.   SOCIAL HABITS: The patient has been residing in a SNF since her discharge earlier this month. She denies any current alcohol, tobacco, or illicit drug use.   FAMILY HISTORY: No known family history of GI malignancy, colon polyps, or IBD that she is aware of.   REVIEW OF SYSTEMS: Ten system review of systems was obtained on the patient. Pertinent positives are mentioned above and otherwise negative.   OBJECTIVE:  VITAL SIGNS: Blood pressure 119/80, heart rate 102, respirations 18, temperature 98.3, bedside pulse oximetry 99%.  GENERAL: This is a pleasant 49 year old female resting quietly and comfortably in bed in no acute distress. Alert and oriented x 3, although, she does appear to be extremely tiresome and somnolent. HEAD: Atraumatic, normocephalic.  NECK: Supple. No lymphadenopathy noted.  HEENT: Sclerae anicteric. Mucous membranes moist.  LUNGS: Respirations are even and unlabored. Clear to auscultation bilateral anterior lung fields.  HEART: Regular rate and rhythm. S1, S2 noted.  ABDOMEN: Soft, nontender, nondistended. Normoactive bowel sounds noted in all four quadrants. No guarding or rebound. No masses, hernias or organomegaly appreciated.  RECTAL: Deferred.  PSYCHIATRIC: Appropriate mood and affect. Once again she does appear to be quite tiresome  NEUROLOGIC:  Cranial nerves II-XII are grossly intact.   LABORATORY, DIAGNOSTIC AND RADIOLOGICAL DATA: [place] Asters  blood cells 3.2,  hemoglobin 7 up from 4.5, hematocrit 21.9, platelets 71, MCV 85. Troponin 0.08, sodium 143, potassium 3.1, BUN 14, creatinine 2.93, glucose 74, bilirubin 0.9, alkaline phosphatase 302, serum, ALT 20, AST 44, albumin 1.  During her previous admission,  we did find a negative hepatitis B, negative RPR. Mycobacterium tuberculosis was negative. Fast smear was positive. CMV was elevated on 11/07/2013.   IMAGING: Chest x-ray was obtained on the patient showing low lung volumes suggestive of a lower lung space disease, but this shows minimal or no significant interval change compared to a prior chest x-ray during her last admission.   ASSESSMENT:  1.  Anemia, that is currently normocytic. This has significantly declined from her prior hospitalization.  2.  Pancytopenia for the past few weeks.  3.  Newly diagnosed human immunodeficiency virus.  4.  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  5.  Recent diagnosis of an esophageal ulcer and duodenitis on esophagogastroduodenoscopy and petechiae on colonoscopy on 11/10/2013.   PLAN: I have discussed this patient's case in detail with Dr. Barnetta ChapelMartin Skulskie, who is involved in the development of the patient's plan of care. At this point, we do feel that her anemia may be multifactorial and, therefore, we will order some iron studies and Hemoccult her stools. We agree with Protonix 40 mg IV b.i.d.  We would like to consult hematology because she has had a profound pancytopenia for the past several weeks. This may also be secondary to her disseminated MAI infection and also her beginning HIV treatment. Certainly, there could be some bone marrow suppression going on and we would like hematology's input on this as well. We will continue to monitor this patient throughout hospitalization, keep a close eye on her hemoglobin and being prepared to transfuse as necessary. This plan was explained to the patient, who verbalized  understanding and all questions were answered.   Thank you so much for this consultation and for allowing us to participate in the patient's plan of care.     ____________________________ Hardie ShackletonKaryn M. Adanna Zuckerman, PA-C kme:ts D: 11/30/2013 16:21:32 ET T: 11/30/2013 16:47:57 ET JOB#: [id number]  cc: Hardie ShackletonKaryn M. Quill Grinder, PA-C, <Dictator> Hardie ShackletonKARYN M Daijon Wenke PA ELECTRONICALLY SIGNED 12/01/2013 8:41

## 2014-09-02 NOTE — Consult Note (Signed)
Chief Complaint:  Subjective/Chief Complaint Still having nausea and vomiting. Vomiting even liquids, which should not happen now with esophageal stricture only.   VITAL SIGNS/ANCILLARY NOTES: **Vital Signs.:   13-May-15 12:12  Vital Signs Type Q 4hr  Temperature Temperature (F) 97.2  Celsius 36.2  Pulse Pulse 111  Respirations Respirations 18  Systolic BP Systolic BP 170  Diastolic BP (mmHg) Diastolic BP (mmHg) 112  Mean BP 131  Pulse Ox % Pulse Ox % 96  Pulse Ox Activity Level  With exertion  Oxygen Delivery 2L   Brief Assessment:  GEN well developed, well nourished, no acute distress   Cardiac Regular   Respiratory clear BS   Gastrointestinal mild epigastric tenderness   Lab Results: Routine Micro:  11-May-15 16:56   Organism Name YEAST  Organism Quantity LIGHT GROWTH  Micro Text Report SPUTUM CULTURE   ORGANISM 1                LIGHT GROWTH YEAST   COMMENT                   ID TO FOLLOW ONCE ISOLATED   GRAM STAIN                FAIR SPECIMEN-70-80% WBC   GRAM STAIN                FEW WHITE BLOOD CELLS   GRAM STAIN  FEW YEAST   GRAM STAIN                MANY GRAM POSITIVE COCCI IN PAIRS   ANTIBIOTIC                       Organism 1 LIGHT GROWTH YEAST  Culture Comment ID TO FOLLOW ONCE ISOLATED  Gram Stain 1 FAIR SPECIMEN-70-80% WBC  Gram Stain 2 FEW WHITE BLOOD CELLS  Gram Stain 3 FEW YEAST  Gram Stain 4 MANY GRAM POSITIVE COCCI IN PAIRS  Result(s) reported on 21 Sep 2013 at 09:04AM.   Assessment/Plan:  Assessment/Plan:  Assessment Dysphgia. Esophageal stricture. Vomiting. Suspect vomiting is unrelated to stricture. Possibly related to her overall renal condition, etc.   Plan Anyway, will plan EGD tomorrow with dilation. Since patient to have dialysis tomorrow, will prob have to do in afternoon. thanks.   Electronic Signatures: Lutricia Feilh, Illias Pantano (MD)  (Signed 13-May-15 14:18)  Authored: Chief Complaint, VITAL SIGNS/ANCILLARY NOTES, Brief Assessment, Lab Results,  Assessment/Plan   Last Updated: 13-May-15 14:18 by Lutricia Feilh, Scheryl Sanborn (MD)

## 2014-09-02 NOTE — Consult Note (Signed)
   Comments   I had a lengthy meeting with pt's sister, Janett Billowita. Sister is the only person besides pt's husband who knows of pt's dx. Sister says that, prior to this illness, pt was was active although she needed a walker to ambulate. She shopped, went out to dinner, etc. Sister agees that pt seems to have "given up" and feels that this is multifactorial including dealing with her illness and having to share her dx. Sister remains hopeful that pt can recover from this illness. Sister also confirms that pt has eaten food brought from home. Encouraged sister and family to bring food to pt. Also encouraged sister to stimulate pt with conversation, keep blinds in room open with lights on, etc.   Electronic Signatures: Tamari Busic, Harriett SineNancy (MD)  (Signed 07-Jul-15 12:34)  Authored: Palliative Care   Last Updated: 07-Jul-15 12:34 by Field Staniszewski, Harriett SineNancy (MD)

## 2014-09-02 NOTE — Consult Note (Signed)
Chief Complaint:  Subjective/Chief Complaint Pt had 3 loose BMS yesterday, 2 this AM, nonbloody.  Denies abdominal pain.  Good appetite & tolerating regular diet.  Denies nausea or vomiting.  C diff negative, stool cx negative, no fecal WBCs.   VITAL SIGNS/ANCILLARY NOTES: **Vital Signs.:   11-Jun-15 08:51  Temperature Temperature (F) 97.6  Celsius 36.4  Temperature Source oral  Pulse Pulse 70  Respirations Respirations 21  Systolic BP Systolic BP 017  Diastolic BP (mmHg) Diastolic BP (mmHg) 76  Mean BP 85  Pulse Ox Activity Level  At rest  Oxygen Delivery 3L; Nasal Cannula   Brief Assessment:  GEN well developed, well nourished, no acute distress, A/Ox3   Cardiac Regular   Respiratory normal resp effort   Gastrointestinal Normal   Gastrointestinal details normal Soft  Nondistended  Bowel sounds normal  No gaurding  No rigidity  +mild TTP bilat lower abd   EXTR negative cyanosis/clubbing, negative edema   Additional Physical Exam Skin: warm, dry, intact   Lab Results: Hepatic:  11-Jun-15 04:06   Bilirubin, Total 0.4  Alkaline Phosphatase  155 (45-117 NOTE: New Reference Range 04/01/13)  SGPT (ALT)  10  SGOT (AST) 37  Total Protein, Serum  5.7  Albumin, Serum  1.5  Routine Chem:  11-Jun-15 04:06   Result Comment labs - This specimen was collected through an   - indwelling catheter or arterial line.  - A minimum of 76ms of blood was wasted prior    - to collecting the sample.  Interpret  - results with caution.   - SMEAR SCANNED  Result(s) reported on 20 Oct 2013 at 07:03AM.  Result Comment CALCIUM - NOTIFIED OF CRITICAL VALUE  - RESULTS VERIFIED BY REPEAT TESTING.  - CALLED TO FAITH HENRY AT 0453:10/20/13  - READ-BACK PROCESS PERFORMED.  - TPL  Result(s) reported on 20 Oct 2013 at 04:51AM.  Glucose, Serum  113  BUN  37  Creatinine (comp)  5.23  Sodium, Serum 136  Potassium, Serum 3.5  Chloride, Serum  97  CO2, Serum 31  Calcium (Total), Serum  6.5   Osmolality (calc) 281  eGFR (African American)  10  eGFR (Non-African American)  9 (eGFR values <663mmin/1.73 m2 may be an indication of chronic kidney disease (CKD). Calculated eGFR is useful in patients with stable renal function. The eGFR calculation will not be reliable in acutely ill patients when serum creatinine is changing rapidly. It is not useful in  patients on dialysis. The eGFR calculation may not be applicable to patients at the low and high extremes of body sizes, pregnant women, and vegetarians.)  Anion Gap 8  Routine Hem:  11-Jun-15 04:06   WBC (CBC) 5.4  RBC (CBC)  3.72  Hemoglobin (CBC)  9.8  Hematocrit (CBC)  31.4  Platelet Count (CBC)  149  MCV 84  MCH 26.4  MCHC  31.3  RDW  20.2  Neutrophil % 89.0  Lymphocyte % 4.0  Monocyte % 6.9  Eosinophil % 0.1  Basophil % 0.0  Neutrophil # 4.8  Lymphocyte #  0.2  Monocyte # 0.4  Eosinophil # 0.0  Basophil # 0.0   Assessment/Plan:  Assessment/Plan:  Assessment Colitis:  Mild colitis on CT .  Likely ischemic colitis which would be self limited.  C diff negative, Culture negative & lack of fecal WBCs may infection unlikely.  She is on antibiotic coverage for PNA.  Given her respiratory status, she is high risk for sedation & risks outweigh benefits at  this point.  Continue supportive measures.  Pt states last colonoscopy normal 2013 in [place].   Hypocalcemia with normal corrected calcium I have discussed her care with Dr Evangeline Gula [facility] & our plan of care is below.   Plan 1) IVFs  2) Continue Supportive measures Dr Allen Norris available tomorrow if needed.  Please call if you have any questions or concerns   Electronic Signatures: Andria Meuse (NP)  (Signed 11-Jun-15 09:17)  Authored: Chief Complaint, VITAL SIGNS/ANCILLARY NOTES, Brief Assessment, Lab Results, Assessment/Plan   Last Updated: 11-Jun-15 09:17 by Andria Meuse (NP)

## 2014-09-02 NOTE — H&P (Signed)
PATIENT NAME:  Laqueta DueGRAVES JONES, Jonise E MR#:  [id number] DATE OF BIRTH:  12-24-1965  DATE OF ADMISSION:  11/29/2013  ADDENDUM:  PHYSICAL EXAMINATION: EXTREMITIES: She actually does have 3+ pitting edema, and for her lower extremity edema, I suspect that this is all related to her protein calorie malnutrition with low albumin level as well as her anemia.    ____________________________ Enola Siebers P. Juliene PinaMody, MD spm:dd D: 11/29/2013 20:03:19 ET T: 11/29/2013 20:09:38 ET JOB#: [id number]  cc: Jolee Critcher P. Juliene PinaMody, MD, <Dictator> Janyth ContesSITAL P Quintez Maselli MD ELECTRONICALLY SIGNED 11/29/2013 20:29

## 2014-09-02 NOTE — Consult Note (Signed)
Chief Complaint:  Subjective/Chief Complaint Says whold body achy and numb. Vomited few times last night. Less chest pain this AM. Troponin level coming down.Some drop in hgb.   VITAL SIGNS/ANCILLARY NOTES: **Vital Signs.:   08-May-15 07:00  Vital Signs Type Routine  Temperature Temperature (F) 97.8  Celsius 36.5  Temperature Source oral  Pulse Pulse 78  Respirations Respirations 24  Systolic BP Systolic BP 87  Diastolic BP (mmHg) Diastolic BP (mmHg) 53  Mean BP 64  Pulse Ox % Pulse Ox % 97  Pulse Ox Heart Rate 78   Brief Assessment:  GEN no acute distress   Cardiac Regular   Respiratory clear BS   Gastrointestinal Normal   Lab Results: Routine Chem:  08-May-15 03:47   Result Comment aptt - This specimen was collected through an   - indwelling catheter or arterial line.  - A minimum of of blood was wasted prior    - to collecting the sample.  Interpret  - results with caution.  Result(s) reported on 16 Sep 2013 at 04:29AM.  Glucose, Serum 90  BUN  20  Sodium, Serum 136  Potassium, Serum  3.1  Chloride, Serum 99  CO2, Serum  34  Calcium (Total), Serum  8.1  Anion Gap  3 (Result(s) reported on 16 Sep 2013 at 07:25AM.)  Osmolality (calc) 274  Routine Coag:  08-May-15 03:47   Activated PTT (APTT)  91.0 (A HCT value >55% may artifactually increase the APTT. In one study, the increase was an average of 19%. Reference: "Effect on Routine and Special Coagulation Testing Values of Citrate Anticoagulant Adjustment in Patients with High HCT Values." American Journal of Clinical Pathology 2006;126:400-405.)  Routine Hem:  08-May-15 03:47   WBC (CBC)  3.2  RBC (CBC)  3.19  Hemoglobin (CBC)  8.6  Hematocrit (CBC)  27.0  Platelet Count (CBC) 190 (Result(s) reported on 16 Sep 2013 at 07:25AM.)  MCV 85  MCH 27.0  MCHC  31.8  RDW  17.2   Radiology Results: XRay:    07-May-15 20:18, Chest Portable Single View  Chest Portable Single View   REASON FOR EXAM:     pneumonia follow up  COMMENTS:       PROCEDURE: DXR - DXR PORTABLE CHEST SINGLE VIEW  - Sep 15 2013  8:18PM     CLINICAL DATA:  Cough, weakness    EXAM:  PORTABLE CHEST - 1 VIEW    COMPARISON:  DG CHEST 1V PORT dated 09/15/2013; DG CHEST 1V PORT dated  07/19/2013    FINDINGS:  There are bilateral interstitial and patchy airspace opacities more  focal in the right lower lobe concerning for multilobar pneumonia  versus pulmonary edema. There is no pleural effusion or  pneumothorax.There is stable cardiomegaly. The osseous structures  are unremarkable.     IMPRESSION:  Bilateral interstitial and patchy airspace opacities more focal in  the right lower lobe concerning for multilobar pneumonia versus  pulmonary edema. There is nosignificant interval change compared  with 09/15/2013.      Electronically Signed    By: Elige Ko    On: 09/15/2013 20:35     Verified By: Joellyn Haff, M.D., MD   Assessment/Plan:  Assessment/Plan:  Assessment Angina. MI. Odynophagia. N/V   Plan For cardiac cath this AM. Dialysis this afternoon. Continue protonix iv bid. Moniter CBC. See yesterday's notes. No plan for EGD unless patient actively bleeds.   Electronic Signatures: Lutricia Feil (MD)  (Signed [phone] 07:58)  Authored:  Chief Complaint, VITAL SIGNS/ANCILLARY NOTES, Brief Assessment, Lab Results, Radiology Results, Assessment/Plan   Last Updated: 08-May-15 07:58 by Lutricia Feilh, Tavia Stave (MD)

## 2014-09-02 NOTE — Consult Note (Signed)
Chief Complaint:  Subjective/Chief Complaint Pt with 3 loose stools x24 hrs.  Continued odynophagia & dysphagia feels like food "sticks in midesophagus".  Denies abdominal pain.  Good appetite.   VITAL SIGNS/ANCILLARY NOTES: **Vital Signs.:   24-Jun-15 08:50  Vital Signs Type Q 4hr  Temperature Temperature (F) 97.5  Celsius 36.3  Pulse Pulse 102  Respirations Respirations 20  Systolic BP Systolic BP 148  Diastolic BP (mmHg) Diastolic BP (mmHg) 88  Mean BP 108  Pulse Ox % Pulse Ox % 96  Pulse Ox Activity Level  At rest  Oxygen Delivery Room Air/ 21 %   Brief Assessment:  GEN well developed, well nourished, no acute distress, A/Ox3. Sister at bedside.   Cardiac Regular   Respiratory normal resp effort   Gastrointestinal Normal   Gastrointestinal details normal Soft  Nondistended  Bowel sounds normal  No gaurding  No rigidity   EXTR negative cyanosis/clubbing, negative edema   Additional Physical Exam Skin: warm, dry, intact   Assessment/Plan:  Assessment/Plan:  Assessment Diarrhea: Recurrent & persistent.  Spoke with Dr Sampson GoonFitzgerald yesterday.  Repeat c diff negative.  Reinitiated flagyl 500mg  TID to cover for pseudomembranous colitis while she remains on antibiotics.  Continue probiotics.  Discontinued reglan.  PRN imodium.  Increased antispasmotic dose. 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  Unfortunately pt is not a candidate for EGD at this time although her respiratory status has improved significantly, she has increased risk of bleeding due to thrombocytopenia as she will likely need esophageal dilation & biopsies.  EGD with Dr Servando SnareWohl once platelets improved.   Plan 1) Continue Supportive measures 2) EGD in near future with improved platelet function over 100 3) Continue PPI 4) CBC AM 5) Continue flagyl, bentyl, probiotics, & PRN imodium Please call if you have any questions or concerns   Electronic Signatures: Joselyn ArrowJones,  Kandice L (NP)  (Signed 24-Jun-15 12:56)  Authored: Chief Complaint, VITAL SIGNS/ANCILLARY NOTES, Brief Assessment, Assessment/Plan   Last Updated: 24-Jun-15 12:56 by Joselyn ArrowJones, Kandice L (NP)

## 2014-09-02 NOTE — Discharge Summary (Signed)
PATIENT NAME:  Veronica Gordon, Veronica Gordon MR#:  [id number] DATE OF BIRTH:  [birth year]  DATE OF ADMISSION:  10/15/2013 DATE OF DISCHARGE:  11/22/2013  Please refer to interim summaries done by previous MDs for further hospital course.   ADMITTING DIAGNOSES:  1.  Nausea.  2.  Vomiting. 3.  Abdominal pain. 4.  Diarrhea. 5.  Dysphagia.  DIAGNOSES AT THE TIME OF DISCHARGE:  1.  Bilateral aspiration pneumonia.  2.  Acute Clostridium difficile colitis, status post treatment.  3.  Septic shock.  4.  Human immunodeficiency virus.  5.  Acute respiratory failure requiring intubation.  6.  Acute encephalopathy.  7.  Dysphagia.  8.  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  9.  Dysphagia, odynophagia, and abdominal pain with due to significant stricture on barium swallowing. EGD shows esophageal ulcers.  10.  End-stage renal disease.  11.  Hyponatremia.  12.  Anemia likely due to anemia of chronic disease, has received Epogen and transfusion.  13.  Anxiety and depression.  14.  Thrombocytopenia.  15.  Hypoglycemia due to poor p.o. intake.  16.  Severe caloric protein malnutrition.   CONSULTANTS DURING THIS HOSPITALIZATION:  1.  Dr. Tedd SiasSolum. 2.  Dr. Mady HaagensenMunsoor Lateef. 3.    Dr. Harriett SineNancy Phifer. 4.  Dr. Sampson GoonFitzgerald. 5.  Dr. Dorothe PeaKoehler. 6.  Candace Gordon from GI. 7.  Dr. Mosetta PigeonHarmeet Singh. 8.  Dr. Toni Amendlapacs.  PERTINENT LABS AND EVALUATIONS: Admitting glucose 84, BUN was 47, creatinine 7.85, sodium 136, potassium 3.4, chloride 99, CO2 28, calcium 9.4. LFTs: Total protein 7.7, albumin 2.1, bilirubin total 0.6, alkaline phosphatase 26. Troponin 0.70, then 0.57 and then 0.54. WBC 7.9, hemoglobin 10, platelet count was 256.  Blood cultures x 2 on admission, no growth. Sputum cultures showed rare pseudomonas aeruginosa . C. difficile was positive on June 12.  Acid fast smear was positive. Acid fast cultures were positive.  Avian complex was negative and tuberculosis was  negative. Giardia and cryptosporidium in the stool was negative. PCP was negative.  QuantiFERON B TB Gold was intermediate. CD4 count was 15. Cryptococcal antigen was negative. Pneumocystic stain was negative. HIV rapid test was positive.   MOST RECENT LABORATORY EVALUATION:  Her most recent hemoglobin July 13, was 8.0.  Cardiac catheterization showed normal coronary circulation.   HOSPITAL COURSE: Please refer to H and P done by the admitting physician. The patient is a 49 year old African American female who was admitted to the hospital on June 6 with multiple complaints as stated above. Who was initially admitted with nausea, vomiting, diarrhea, and abdominal pain. The patient has developed acute respiratory failure due to significant vomiting. She had developed bilateral aspiration pneumonia. She was transferred to the ICU and had a bronchoscopy done by Dr. Belia HemanKasa. Sputum did show Pseudomonas. Initially, there was some also concern for suspected TB.  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 She has been treated under the direction of Dr. Sampson GoonFitzgerald with clarithromycin and ethambutol.  She will need outpatient follow-up with them. The patient does have HIV and is started on treatment with HART. She will need to follow up with Dr. Sampson GoonFitzgerald as outpatient to maintain and follow-up on her HIV. The patient also had significant dysphasia or odynophagia and had an EGD which showed a stricture and esophageal ulcer. The patient also has had issues with hypoglycemia recently and was seen by endocrinology. She was given D10.  Her appetite  is improved. Her sugars are improved. The patient has a lot of ongoing issues and requires rehabilitation. At this time, arrangements for rehabilitation are made. She will need a close ID follow-up.   DISCHARGE MEDICATIONS: Trazodone 100 at bedtime, gabapentin 600 one tab p.o. at bedtime,  Zofran (Dictation Anomaly)  mg daily as needed, PhosLo 2 tabs b.i.d., nystatin 5 mL q. 6 p.r.n. swish and swallow, aspirin 81 mg 1 tab p.o. daily, Tylenol 650 q. 4  p.r.n. for pain or temperature, acetaminophen/hydrocodone 325/5 mg 1 tab p.o. q. 4 p.r.n., mirtazapine 15 at bedtime, loperamide 2 mg 1 tab 4 times a day as needed, palonosetron 30 mL q. 4 as needed for odynophagia, Megace 10 mL b.i.d., aripiprazole 5 mg daily, (Dictation Anomaly)nontender 1 ng at bedtime, ethambutol  400 mg 3 caps Tuesday, Thursday, and Saturday; stop date to be determined by Dr. Sampson Goon,  (Dictation Anomaly) 1 tab p.o. q. 4 p.r.n., lamivudine 2.5 q. 24, Tenofovir 300 daily, raltegravir 400 mg 1 tab p.o. b.i.d., lorazepam 1 mg daily as needed for anxiety, methylphenidate 10 daily as needed, Lidocaine 5% one patch topically daily, clarithromycin 500 mg 1 tab p.o. q. 12, stop per Dr. Sampson Goon, Protonix 40 daily, nicotine inhalation p.r.n. for nicotine craving, calcium plus vitamin D 1 tab p.o. b.i.d., Ensure plus 1 (Dictation Anomaly) p.o. b.i.d., glucagon as needed for low blood sugar, and oxygen: 2 liters nasal cannula.   DIET: Regular.   ACTIVITY: As tolerated. PT evaluation and treatment. Follow Dr. Sampson Goon in 1-2 weeks. Skilled nursing facility MD to follow her as well. The patient to have Accu-Cheks every 6 hours when awake until blood glucose is stable.   TIME SPENT: 45 minutes on this discharge.    ____________________________ Lacie Scotts. Allena Katz, MD shp:ts D: 11/22/2013 13:06:52 ET T: 11/22/2013 14:04:15 ET JOB#: [id number]  cc: Carlen Fils H. Allena Katz, MD, <Dictator> Charise Carwin MD ELECTRONICALLY SIGNED 11/27/2013 9:45

## 2014-09-02 NOTE — Consult Note (Signed)
Brief Consult Note: Diagnosis: Asked to see patient for esophageal stricture with diarrhea nausea and vomiting. When coming to see her for an EGD today, the patient in lethargic. She has not had any nausea or vomiting or diarrhea today.   Patient was seen by consultant.   Comments: The patient has an esophageal stricture but needs her acute respitory problems addressed before she can under go any endoscopic procedures.  Electronic Signatures: Midge MiniumWohl, Avangelina Flight (MD)  (Signed 07-Jun-15 12:27)  Authored: Brief Consult Note   Last Updated: 07-Jun-15 12:27 by Midge MiniumWohl, Francois Elk (MD)

## 2014-09-02 NOTE — Consult Note (Signed)
Chief Complaint:  Subjective/Chief Complaint Pt states diarrhea improved stool more formed.  Also reports dysphagia improved.  Denies abdominal pain.   VITAL SIGNS/ANCILLARY NOTES: **Vital Signs.:   30-Jun-15 04:43  Respirations Respirations 18   Brief Assessment:  GEN well developed, well nourished, no acute distress, A/Ox3   Cardiac Regular   Respiratory normal resp effort   Gastrointestinal Normal   Gastrointestinal details normal Soft  Nondistended  Bowel sounds normal  No gaurding  No rigidity   EXTR negative cyanosis/clubbing, negative edema   Additional Physical Exam Skin: warm, dry, intact   Lab Results: Routine Hem:  30-Jun-15 06:29   WBC (CBC) 4.9  RBC (CBC)  3.24  Hemoglobin (CBC)  8.6  Hematocrit (CBC)  28.3  Platelet Count (CBC)  106 (Result(s) reported on 08 Nov 2013 at 07:15AM.)  MCV 87  MCH 26.6  MCHC  30.5  RDW  21.9  Bands 1  Segmented Neutrophils 82  Lymphocytes 10  Monocytes 4  Metamyelocyte 2  Myelocyte 1  NRBC 1  Diff Comment 1 HYPOCHROMIA  Diff Comment 2 TARGET CELLS  Diff Comment 3 TEARDROP CELLS  Diff Comment 4 PLTS VARIED IN SIZE  Diff Comment 5 ANISOCYTOSIS  Diff Comment 6 POIKILOCYTOSIS  Result(s) reported on 08 Nov 2013 at 07:15AM.   Assessment/Plan:  Assessment/Plan:  Assessment Diarrhea: Prep today for colonoscopy Dysphagia/odynophagia:  ?candida esophagitis or other opportunistic infection given HIV.  Plan for EGD tomorrow.   Plan 1) Continue Supportive measures 2) EGD/colonoscopy tomorrow 3) Continue plan of care Please call if you have any questions or concerns   Electronic Signatures: Joselyn ArrowJones, Jantzen Pilger L (NP)  (Signed 30-Jun-15 08:24)  Authored: Chief Complaint, VITAL SIGNS/ANCILLARY NOTES, Brief Assessment, Lab Results, Assessment/Plan   Last Updated: 30-Jun-15 08:24 by Joselyn ArrowJones, Keiri Solano L (NP)

## 2014-09-02 NOTE — Consult Note (Signed)
PATIENT NAME:  Veronica Gordon, Veronica Gordon MR#:  [id number] DATE OF BIRTH:  02-11-1966  DATE OF CONSULTATION:  10/26/2013  REFERRING PHYSICIAN:   CONSULTING PHYSICIAN:  Audery AmelJohn T. Clapacs, MD  IDENTIFYING INFORMATION AND REASON FOR CONSULTATION: A 49 year old woman with multiple medical problems. Consult requested because of concern about depression.   HISTORY OF PRESENT ILLNESS: Information obtained from the patient and the chart. The patient's chief complaint is "I guess I'm all right." Consultation was ordered because it was concerning that she may be depressed and that she was refusing food. This is a 49 year old woman who has end-stage renal disease and is on dialysis. She is currently in the hospital for a lengthy stay, which has treated multiple problems, but appears to currently have diagnosed her with HIV, probably AIDS and possibly tuberculosis. The patient states that she is feeling tired and worn out and does not feel enough energy to talk much. She tells me that she feels like her mood is not depressed. She denies feeling hopeless. She denies any suicidal ideation or wish to die. Denies any hallucinations. States that she does feel tired as mentioned above. Denies any specific problems with sleeping. As far as her appetite, she admits that she just not hungry anymore, although she also emphasizes that she is having pain when she swallows because of her esophageal stricture. She is currently taking duloxetine 60 mg per day and bupropion XL 300 mg a day as an antidepressant medicines and from, what I can tell, has been on both of those for a few years.   PAST PSYCHIATRIC HISTORY: Denies any psychiatric hospitalization in the past. Denies any suicide attempts. She cannot recall any other antidepressant she has ever been prescribed in the past. She denies that she drinks alcohol or abuses drugs or has any past history of substance abuse.   SOCIAL HISTORY: The patient lives with her father and her daughter.  She has another son who lives in another state. She says that she has a good relationship with her family and they visit often.   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 She is in denial so to speak about this from what I can see in the notes because it looks like she has probably [facility], but has continued to claim that she was never told about that. She has a pulmonary infection, with acid-fast bacteria, quite possibly tuberculosis, although pending. She has end-stage renal failure and is on dialysis.   FAMILY HISTORY: Denies any family history of mental illness.   REVIEW OF SYSTEMS: Feels tired. Feels sleepy. No appetite. Pain when she swallows. Denies feeling depressed. Denies suicidal or homicidal ideation. Denies any hallucinations. The rest of the review of systems is negative.   MENTAL STATUS EXAM: A sick-appearing woman interviewed in her hospital room. She is pretty isolated because she has to stay in a negative pressure room. The patient was awake and interacted with me, but was very passive. Made no eye contact whatsoever. She did not make any effort to move during our conversation. Her speech was quiet and at times difficult to understand. Affect was flat. Mood was stated as fine. Thoughts appear to be slow and rather concrete. No sign of delusional thinking from what I can tell. Denies hallucinations. Denies suicidal or homicidal ideation. Did not do any cognitive testing, although she is at least alert and oriented to being in the hospital and her basic situation.  She appeared to be an at least somewhat adequate historian. Judgment and insight presumed to be intact.   CURRENT MEDICATIONS: Wellbutrin XL 300 mg daily; calcium acetate 1 capsule 3 times a day; Cinacalcet tablet 60 mg once per day; ciprofloxacin tablet 500 mg once a day; Bentyl 10 mg q.8 hours; duloxetine 60 mg per day; ferrous sulfate 325 mg twice a day; fluconazole 100  mg daily; gabapentin 600 mg at night; isoniazid tablet 300 mg once a day; lactobacillus 3 times a day; metoclopramide 5 mg IV 3 times a day; metronidazole 500 mg every 8 hours; vitamin B6 50 mg once an hour; rifampin 600 mg orally once per day; Requip 1 mg at night; trazodone 100 mg at night and heparin injection q.8 hours.   ALLERGIES: No known drug allergies.   ASSESSMENT: This is a 49 year old woman, who has severe medical problems. End-stage renal disease on dialysis. HIV positive with a CD4 count and other infections that likely represent AIDS. Possibly tuberculosis. She is denying feeling depressed, but she is only a partially reliable historian and is poorly interactive. Denies suicidal ideation. Admits; however, that her appetite is poor and that she is not eating well. In this situation, given all of her medical problems, it is difficult to assess depression, particularly when she is not very cooperative. The past history is unclear. It certainly would be expected if a person in this situation had symptoms of depression as well.   One thing that stands out to me is that she is currently taking duloxetine. The FDA strongly recommends that duloxetine not be used in people with any degree of significant liver insufficiency.   TREATMENT PLAN: I propose discontinuing the duloxetine because of her liver problems. I am going to replace it with Remeron 15 mg at night to start with for her sleep and appetite and depression. We may increase that to 30 if she tolerates it, but I will start low because of her kidney and liver problems. Continue the Wellbutrin for now. I do not see any need for a sitter or any other more intensive treatment. I will follow up with her in the hospital as needed.   DIAGNOSIS, PRINCIPAL AND PRIMARY:  AXIS I: Depression, not otherwise specified; rule out adjustment disorder versus major depression.  AXIS II: No diagnosis.  AXIS III: Pneumonia, possibly tuberculosis, HIV,  possibly AIDS, end-stage renal disease on dialysis, gastroparesis and restless leg syndrome.  AXIS IV: Severe from the degree of her illness.  AXIS V: Functioning at time of evaluation 35.   ____________________________ Audery Amel, MD jtc:aw D: 10/26/2013 13:46:24 ET T: 10/26/2013 14:03:02 ET JOB#: [id number]  cc: Audery Amel, MD, <Dictator> Audery Amel MD ELECTRONICALLY SIGNED 11/01/2013 17:11

## 2014-09-02 NOTE — Consult Note (Signed)
Chief Complaint:  Subjective/Chief Complaint Pt's diarrhea resolved.  Continued odynophagia & dysphagia feels like food "sticks in midesophagus".  Platelets 57.  Denies abdominal pain.  Good appetite.   VITAL SIGNS/ANCILLARY NOTES: **Vital Signs.:   22-Jun-15 05:22  Temperature Temperature (F) 98  Celsius 36.6  Temperature Source oral  Pulse Pulse 96  Respirations Respirations 18  Systolic BP Systolic BP 048  Diastolic BP (mmHg) Diastolic BP (mmHg) 92  Mean BP 111  Pulse Ox % Pulse Ox % 96  Pulse Ox Activity Level  At rest  Oxygen Delivery 2L   Brief Assessment:  GEN well developed, well nourished, no acute distress, A/Ox3   Cardiac Regular   Respiratory normal resp effort   Gastrointestinal Normal   Gastrointestinal details normal Soft  Nondistended  Bowel sounds normal  No gaurding  No rigidity   EXTR negative cyanosis/clubbing, negative edema   Additional Physical Exam Skin: warm, dry, intact   Lab Results:  Hepatic:  20-Jun-15 19:30   Albumin, Serum  1.4  Routine Micro:  21-Jun-15 16:28   Micro Text Report CLOSTRIDIUM DIFFICILE   C.DIFFICILE ANTIGEN       C.DIFFICILE GDH ANTIGEN : NEGATIVE   C.DIFFICILE TOXIN A/B     C.DIFFICILE TOXINS A AND B : NEGATIVE   INTERPRETATION            Negative for C. difficile.    ANTIBIOTIC                        Routine Chem:  20-Jun-15 12:42   Result Comment LABS - This specimen was collected through an   - indwelling catheter or arterial line.  - A minimum of 35ms of blood was wasted prior    - to collecting the sample.  Interpret  - results with caution. PLATELETS - VERIFIED BY SMEAR ESTIMATE  Result(s) reported on 29 Oct 2013 at 02:25PM.    19:30   Result Comment LABS - This specimen was collected through an   - indwelling catheter or arterial line.  - A minimum of 561m of blood was wasted prior    - to collecting the sample.  Interpret  - results with caution. PLATELETS - VERIFIED BY SMEAR ESTIMATE  Result(s)  reported on 29 Oct 2013 at 08:44PM.  Result Comment CALCIUM - NOTIFIED OF CRITICAL VALUE  - RESULTS VERIFIED BY REPEAT TESTING.  - CALLED TO IVIE FRYAR 10/29/13 AT  - 2105 BY JEM  - READ-BACK PROCESS PERFORMED.  Result(s) reported on 29 Oct 2013 at 09:09PM.  Glucose, Serum 77  BUN  21  Creatinine (comp)  3.77  Sodium, Serum 136  Potassium, Serum 3.6  Chloride, Serum 99  CO2, Serum 31  Calcium (Total), Serum  6.7  Phosphorus, Serum  1.4  Anion Gap  6  Osmolality (calc) 274  eGFR (African American)  15  eGFR (Non-African American)  13 (eGFR values <6084min/1.73 m2 may be an indication of chronic kidney disease (CKD). Calculated eGFR is useful in patients with stable renal function. The eGFR calculation will not be reliable in acutely ill patients when serum creatinine is changing rapidly. It is not useful in  patients on dialysis. The eGFR calculation may not be applicable to patients at the low and high extremes of body sizes, pregnant women, and vegetarians.)  Routine Hem:  20-Jun-15 12:42   WBC (CBC) 6.9  RBC (CBC)  3.46  Hemoglobin (CBC)  9.0  Hematocrit (CBC)  29.0  Platelet Count (  CBC)  59  MCV 84  MCH  25.9  MCHC  30.9  RDW  20.5  Neutrophil % 90.9  Lymphocyte % 4.1  Monocyte % 4.3  Eosinophil % 0.3  Basophil % 0.4  Neutrophil # 6.3  Lymphocyte #  0.3  Monocyte # 0.3  Eosinophil # 0.0  Basophil # 0.0    19:30   WBC (CBC) 5.7  RBC (CBC)  3.13  Hemoglobin (CBC)  8.2  Hematocrit (CBC)  26.5  Platelet Count (CBC)  45  MCV 84  MCH 26.2  MCHC  31.0  RDW  20.2  Neutrophil % 93.0  Lymphocyte % 3.5  Monocyte % 2.7  Eosinophil % 0.2  Basophil % 0.6  Neutrophil # 5.3  Lymphocyte #  0.2  Monocyte # 0.2  Eosinophil # 0.0  Basophil # 0.0  21-Jun-15 07:33   WBC (CBC) 5.5  RBC (CBC)  3.11  Hemoglobin (CBC)  8.1  Hematocrit (CBC)  26.3  Platelet Count (CBC)  57  MCV 85  MCH 26.2  MCHC  30.9  RDW  20.9  Neutrophil % 89.1  Lymphocyte % 4.7  Monocyte  % 5.5  Eosinophil % 0.2  Basophil % 0.5  Neutrophil # 4.9  Lymphocyte #  0.3  Monocyte # 0.3  Eosinophil # 0.0  Basophil # 0.0 (Result(s) reported on 30 Oct 2013 at 09:05AM.)   Assessment/Plan:  Assessment/Plan:  Assessment Colitis: Resolved with empirical c diff treatment. 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  Unfortunately pt is not a candidate for EGD at this time although her respiratory status has improved significantly, she has increased risk of bleeding due to thrombocytopenia as she will likely need esophageal dilation & biopsies.  EGD with Dr Allen Norris once platelets improved.   I have discussed her care with Dr Evangeline Gula [facility] & our plan of care is below.   Plan 1) Continue Supportive measures 2) EGD in near future 3) Continue PPI 4) CBC AM Please call if you have any questions or concerns   Electronic Signatures: Andria Meuse (NP)  (Signed 22-Jun-15 08:48)  Authored: Chief Complaint, VITAL SIGNS/ANCILLARY NOTES, Brief Assessment, Lab Results, Assessment/Plan   Last Updated: 22-Jun-15 08:48 by Andria Meuse (NP)

## 2014-09-02 NOTE — Consult Note (Signed)
Pt had CP earlier today. BG low. THus, given D50.Hospitalist evaluated patient and felt CP noncardiac. Troponin level ok. Unfortunately, anesthesia cancelled procedure again. We will try again to schedule EGD 1st thing tomorrow AM BEFORE dialysis.  Electronic Signatures: Lutricia Feilh, Wister Hoefle (MD)  (Signed on 15-May-15 13:16)  Authored  Last Updated: 15-May-15 13:16 by Lutricia Feilh, Faron Whitelock (MD)

## 2014-09-02 NOTE — Consult Note (Signed)
Chief Complaint:  Subjective/Chief Complaint Please see full GI consult and brief consult note.  Patietn seen and examined, chart reviewed.  Paitent presenting with marked decline of hgb, but denies hematemesis, black or bloody stools.  No abdominal pain, some heartburn/GERD symptoms.  EGD 11/10/13 showing multiple esophageal ulcers.  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  Ethambutol can be a marrow suppresant as well.  Please see recommendations  in consult. Following, discussed with Dr Sampson GoonFitzgerald.   Electronic Signatures: Barnetta ChapelSkulskie, Oleta Gunnoe (MD)  (Signed 22-Jul-15 17:34)  Authored: Chief Complaint   Last Updated: 22-Jul-15 17:34 by Barnetta ChapelSkulskie, Ammar Moffatt (MD)

## 2014-09-02 NOTE — Consult Note (Signed)
PATIENT NAME:  Veronica Gordon, Geralda E MR#:  [id number] DATE OF BIRTH:  1965/05/19  DATE OF CONSULTATION:  09/23/2013  REFERRING PHYSICIAN:  Srikar R. Sudini, MD CONSULTING PHYSICIAN:  A. Wendall MolaMelissa Amos Micheals, MD PRIMARY CARE PROVIDER: Serita ShellerErnest B. Maryellen PileEason, MD  CHIEF COMPLAINT: Hypoglycemia.   HISTORY OF PRESENT ILLNESS: This is a 49 year old female with end-stage renal disease on 3 times weekly dialysis, hypertension, hyperlipidemia, NSTEMI, admitted on May 7 with complaints of chest pain brought on by eating. She also reported nausea, vomiting, and abdominal pain. She has undergone a work-up, including cardiac work-up, for her discomfort. She has been found to have a reversible perfusion [facility], 2013, and cardiac catheterization revealed normal coronary arteries with an LVEF of 53%. A barium swallow was performed, which showed esophageal stricture. Due to persistent nausea, the patient was scheduled to undergo EGD today; however, this was delayed when she was found to have hypoglycemia. Over the last 24 hours, fingerstick blood sugars have been in the 32 to 133 range. She denies any symptoms of low blood sugars including shakes, sweats, hunger, heart racing, or weakness. She was treated with several amps of dextrose and then put on a 10% IV dextrose drip at 75 mL/hour.   She has a history of morbid obesity. She reports a 150-pound weight loss over the last 18 months. Recalls max weight was 336 pounds. At this time, she is on a pureed diet. States she is eating less than 50% of dietary trays, as she dislikes this diet and again is having the persistent nausea.   No known history of diabetes. Has never taken medications for diabetes. Denies recent exposure to insulin or sulfonylureas. MAR was reviewed, and there has been no documented exposure to insulin or sulfonylureas during her hospitalization.   PAST MEDICAL HISTORY: 1.  End-stage renal disease.  2.  Hypertension.  3.  Atrial  fibrillation, admission March 2015.  4.  Depression.  5.  Tobacco dependence.  6.  Peptic ulcer disease.  7.  Esophageal stricture.  8.  MRSA spine osteomyelitis, June 2014.   CURRENT MEDICATIONS: 1.  Amiodarone 200 mg daily.  2.  Calcium acetate 667 mg t.i.d. a.c.  3.  Sensipar 60 mg daily.  4.  Bupropion XR 300 mg at bedtime.  5.  Duloxetine 60 mg at bedtime.  6.  Iron elixir 220 mg b.i.d.  7.  Diflucan 50 mg daily.  8.  Gabapentin 600 mg at bedtime.  9.  Atarax 10 mg t.i.d.  10.  Levaquin 500 mg q. 48 hours.  11.  Reglan 5 mg IV push t.i.d.  12.  Nystatin swish and swallow 5 mL q.6 hours.  13.  Protonix 40 mg b.i.d.  14.  Requip 1 mg at bedtime.  15.  Renvela powder 1.6 grams b.i.d.  16.  Trazodone 100 mg at bedtime.  17.  Aspirin 81 mg daily.  18.  Heparin 5000 units subcutaneous q.8 hours.  19.  EPO 4000 units with dialysis.  20.  D10 IV fluid at 75 mL/hour.   ALLERGIES: No known drug allergies.   SOCIAL HISTORY: The patient lives with her family. She smokes 2 to 3 cigarettes per day. Denies alcohol use.   FAMILY HISTORY: Mother and one uncle had diabetes. No known family members with hypoglycemia. No known adrenal disorders in the family.   REVIEW OF SYSTEMS:    GENERAL: Weight loss as per HPI. No fever.  HEENT: No blurred vision. No sore  throat.  NECK: No neck pain. No dysphagia.  CARDIAC: Denies chest pain at this time. Did have an episode of chest pain earlier today.  PULMONARY: No cough. No shortness of breath at rest.  ABDOMEN: Reports abdominal pain. Reports nausea.  EXTREMITIES: Denies leg swelling.  SKIN: Denies recent skin changes or pruritus. ENDOCRINE: Denies heat or cold intolerance.  HEMATOLOGIC: Denies recent bleeding. Reports easy bruisability.   PHYSICAL EXAMINATION: VITAL SIGNS: Height 66.9 inches, weight 154 pounds, BMI 24.3. Temperature 97.5, pulse 73, respirations 18, blood pressure 158/89, pulse oximetry 97% on 2 liters at rest.  GENERAL:  African American female, appears older than stated age, well-developed.  HEENT: EOMI. Oropharynx is clear. Mucous membranes moist.  NECK: Supple. No appreciable thyromegaly.  CARDIAC: Regular rate and rhythm. There is an audible systolic murmur throughout the pericardium.  PULMONARY: Clear to auscultation bilaterally. No wheeze.  ABDOMEN: Mild tenderness diffusely, nonfocal. No guarding. Positive bowel sounds.  EXTREMITIES: No peripheral edema is present.  SKIN: No rash or dermatopathy noted.  INTRAVENOUS ACCESS: She has a left upper extremity AV fistula and a right groin central line.  PSYCHIATRIC: Calm, cooperative.  NEUROLOGIC: Alert, oriented. No focal deficits.   LABORATORY DATA: Hemoglobin A1c 4.6% TSH of 0.78. Troponin I 0.07. Fingerstick blood sugars in the last 12 hours have been in the 45 to 133 range.   ASSESSMENT:  1. Hypogylcemia 2. ESRD 3. Peripheral vascular disease 4. Nausea, poor PO intake and significant weight loss  Causes of hypoglycemia in this individual is likely poor glucose stores in setting of poor PO intake and significant weight loss. Other possible causes inclue: erroneous values in setting of peripheral vascular disease and end-stage renal disease, exposure to hypoglycemia-causing agent such as insulin or sulfonylurea, or hyperinsulinemic state as is seen more commonly in post-bypass patients who have significant weight loss.   RECOMMENDATIONS: 1.  Stop IV dextrose.  2.  Continue monitoring fingerstick blood sugars q.2 hours.  3.  As long as she is asymptomatic, there is little need to treat her hypoglycemia. I recommend no treatment unless blood sugar is at least less than 40, and at that time may gave 1 amp D25 and then repeat blood sugar as per routine schedule. Additionally, if fingerstick blood sugar is less than 60, could obtain a serum glucose to confirm it is truly low. If blood sugar is less than 45, then in addition to obtaining a serum glucose, would  also obtain a serum cortisol (normal would be greater than 18), insulin, and C-peptide levels. Would expect if this is due to poor glucose stores, that her c-peptide and insulin would be low when serum glucose is low. Again, no treatment needed as long as blood sugars are over 45 and she is asymptomatic. Avoid continuous IV dextrose.   I will be unavailable to see the patient over the weekend. I will return on Monday, May 18, and if she remains in the hospital will see her at that time.   Thank you for the kind request for consultation.   ____________________________ A. Wendall Mola, MD ams:jcm D: 09/23/2013 17:25:25 ET T: 09/23/2013 18:53:57 ET JOB#: [id number]  cc: A. Wendall Mola, MD, <Dictator> Serita Sheller. Maryellen Pile, MD Caleen Jobs Jenavie Stanczak MD ELECTRONICALLY SIGNED 09/25/2013 12:08

## 2014-09-02 NOTE — Consult Note (Signed)
   Comments   I spoke with pt about getting a PEG placed. She did not refuse. She wants to talk with her husband about it and says that she will call him today to discuss. I will follow up later today to discuss further.   Electronic Signatures: Jasyah Theurer, Harriett SineNancy (MD)  (Signed 24-Jul-15 09:56)  Authored: Palliative Care   Last Updated: 24-Jul-15 09:56 by Brittin Belnap, Harriett SineNancy (MD)

## 2014-09-02 NOTE — H&P (Signed)
PATIENT NAME:  Veronica Gordon, Veronica Gordon MR#:  [id number] DATE OF BIRTH:  07/20/1965  DATE OF ADMISSION:  07/19/2013  PRIMARY CARE PROVIDER: Serita ShellerErnest B. Maryellen PileEason, MD  EMERGENCY DEPARTMENT REFERRING PHYSICIAN: Su Leyobert L. Kinner, MD  CHIEF COMPLAINT: Atrial fibrillation with rapid ventricular response.   HISTORY OF PRESENT ILLNESS: The patient is a 49 year old white female with history of atrial fibrillation. She has moved back and [place] from [place], currently recently moved back to here. Has a history of atrial fibrillation and has [place] cardi[place], [place]. Also required admission for atrial fibrillation with RVR 2 months prior. She states that this morning she was not feeling good. She was having nausea, and then she went to dialysis. During dialysis, she started not feeling well. She started having difficulty with breathing and started to have a spell where she felt like her whole body was shaking. They checked her pulse. Her heart rate was noted to be in the 150s. She was brought to the ED and was noted to be in atrial fibrillation with RVR. Given IV Cardizem bolus, without any significant improvement in the heart rate, so had to be started on a Cardizem drip. The patient currently on Cardizem drip. She denies any chest pains or palpitations. She has shortness of breath. Complains of nausea, but denies any diarrhea. Denies any urinary symptoms. She otherwise denies any fevers or chills.   PAST MEDICAL HISTORY:  1. History of end-stage renal disease, on hemodialysis Tuesday, Thursday, Saturday.  2. History of atrial fibrillation, with cardiac evaluation done in [place], including a cardiac catheterization, according to her, which was negative. She did have an echo which showed decreased heart function.  3. Hypertension.  4. History of MRSA on the buttocks area.  5. History of depression, anxiety. 6. Listed as having diabetes. She reports that her sugars  are normally low with hypoglycemia.   ALLERGIES: MIDAZOLAM.    CURRENT MEDICATIONS: She is on:  1. Carvedilol 6.25 one tab p.o. b.i.d. 2. Clonidine 0.1 one tab p.o. t.i.d.  3. Cymbalta 60 at bedtime.  4. Iron sulfate 325 one tab p.o. t.i.d. 5. Gabapentin 600 at bedtime.  6. Hydralazine 60 mg q.8 hours. 7. Lorazepam 1 mg daily. 8. PhosLo 667 mg 2 tabs daily. 9. Renagel 1600 mg b.i.d.  10. Ropinirole 1 mg at bedtime. 11. Sensipar 60 daily.  12. Trazodone 100 at bedtime. 13. Wellbutrin XL 300 at bedtime. 14. Zofran 8 mg 1 tab p.o. daily as needed.   SOCIAL HISTORY: Smokes, according to her, only 2 to 3 cigarettes per day. No alcohol or drug use.   FAMILY HISTORY: Positive for hypertension and end-stage renal disease in her mother.   REVIEW OF SYSTEMS:  CONSTITUTIONAL: Denies any fevers. Complains of fatigue, weakness. No pain. No weight loss. No weight gain.  EYES: No blurred or double vision. No pain. No redness. No inflammation.  ENT: No tinnitus. No ear pain. No hearing loss. No seasonal or year-round allergies. No epistaxis. No difficulty swallowing.  RESPIRATORY: Denies any cough, wheezing, hemoptysis. No COPD.  CARDIOVASCULAR: Denies any chest pain, orthopnea. Has some edema.  GASTROINTESTINAL: Complains of some nausea, but no vomiting or diarrhea. No abdominal pain. No hematemesis. No melena.  GENITOURINARY: Denies any dysuria, hematuria, renal calculus or frequency.  ENDOCRINE: Denies any polyuria, nocturia or thyroid problems.  HEMATOLOGIC AND LYMPHATIC: Denies anemia, easy bruisability or bleeding.  SKIN: No acne. No rash. No changes in hair, mole or skin.  MUSCULOSKELETAL:  Denies any pain in the neck, back or shoulder.  NEUROLOGIC: No numbness, CVA, TIA, seizures.  PSYCHIATRIC: Has anxiety, depression.   PHYSICAL EXAMINATION:  VITAL SIGNS: Temperature 98.1, pulse 157, respirations 18, blood pressure 122/74, O2 92%.  GENERAL: The patient is an obese African-American  female.  HEENT: Head atraumatic, normocephalic. Pupils equally round and reactive to light and accommodation. There is no conjunctival pallor. No scleral icterus. Nasal exam shows no drainage or ulceration. Oropharynx is clear, without any exudate.  NECK: Supple, without any JVD.  CARDIOVASCULAR: Irregularly irregular rhythm without any murmurs, rubs, clicks or gallops.  LUNGS: Clear to auscultation bilaterally without any rales, rhonchi or wheezing.  ABDOMEN: Soft, nontender, nondistended. Positive bowel sounds x4.  EXTREMITIES: She has got 1+ edema. Minimal erythema on the left leg.  SKIN: Discoloration noted on the foot.  NEUROLOGIC: Awake, alert and oriented x3. No focal deficits.  PSYCHIATRIC: Not anxious or depressed.  LYMPHATIC: No lymph nodes palpable.   EVALUATIONS IN THE EMERGENCY DEPARTMENT: EKG with atrial fibrillation with RVR. Chest x-ray shows findings consistent with mild CHF. Glucose 78, BUN 34, creatinine is 5.64, sodium 137, potassium 4.0, chloride 100, CO2 29, total protein 8.5, albumin of 2.9. WBC 5.5, hemoglobin 11.9, platelet count 156. TSH 0.78. Troponin 0.12.   ASSESSMENT AND PLAN: The patient is a 49 year old African-American female with history of atrial fibrillation, who comes in with symptoms of atrial fibrillation with rapid ventricular response.   1. Atrial fibrillation with rapid ventricular response. At this time, is started on Cardizem drip. We will continue the Cardizem drip, titrate for a heart rate less than 100. Will get cardiology consult and echocardiogram. Her TSH is normal. Will place her on aspirin. Cardiology to decide if the patient needs any further anticoagulation besides aspirin, and the echo may assist [place] in deciding this as well.  2. End-stage renal disease. Hemodialysis per nephrology. Continue binders as taking at home.  3. Hypertension. Will continue clonidine. Blood pressure low-normal due to Cardizem. Will hold all other medications.  4.  Depression and anxiety. Continue Ativan and Wellbutrin.  5. Nicotine addiction. The patient counseled regarding smoking cessation. Does not want to stop smoking. Is not interested in nicotine patch. I strongly recommended she stop smoking, 4 minutes spent on this.  NOTE: Critical care time spent 55 minutes. The patient at a high risk of cardiopulmonary arrest, hypotension inside of her heart rate being very elevated and being on an IV Cardizem drip, which will need continuous IV monitoring.   ____________________________ Lacie Scotts. Allena Katz, MD shp:lb D: 07/19/2013 12:15:46 ET T: 07/19/2013 13:12:40 ET JOB#: [id number]  cc: Zacharey Jensen H. Allena Katz, MD, <Dictator> Charise Carwin MD ELECTRONICALLY SIGNED 07/27/2013 8:23

## 2014-09-02 NOTE — Consult Note (Signed)
PATIENT NAME:  Veronica Gordon, Chellsie E MR#:  [id number] DATE OF BIRTH:  1965-11-20  DATE OF CONSULTATION:  07/20/2013  CONSULTING PHYSICIAN:  Laurier NancyShaukat A. Loraine Bhullar, MD  INDICATION FOR CONSULTATION: Atrial fibrillation.   HISTORY OF PRESENT ILLNESS: This is a 49 year old African American female with a past medical history of hypertension, end-stage renal disease on dialysis 3 times a week who presented to the Emergency Room with palpitations, shortness of breath, and was found to be in atrial fibrillation with rapid ventricular response rate. She was feeling much better when I saw her. She denies any chest pain or palpitation and has converted into sinus rhythm.   PAST MEDICAL HISTORY: History of end-stage renal disease. She had [place] atrial [place] in the [place] and had a cardiac catheterization at that time which was negative. History of hypertension, MRSA infection of the buttock, history of depression, history of diabetes.    MEDICATIONS: Carvedilol 6.25 b.i.d., clonidine, hydralazine.   SOCIAL HISTORY: Smokes 2 to 3 cigarettes per day. No EtOH abuse.   FAMILY HISTORY: Positive for hypertension and end-stage renal disease.   PHYSICAL EXAMINATION:  GENERAL: Alert and oriented x3, in no acute distress.  VITAL SIGNS: Blood pressure is 112/72, respirations 18, pulse 72, temperature 97.4, and saturation 97.  NECK: No JVD.  LUNGS: Clear.  HEART: Regular rate and rhythm. Normal S1, S2. No audible murmur.  ABDOMEN: Soft, nontender, positive bowel sounds.  EXTREMITIES: No pedal edema.  NEUROLOGIC: Appears to be intact.   DIAGNOSTIC STUDIES: EKG shows Afib 155 beats per minute, nonspecific ST-T changes.   EKG today shows normal sinus rhythm, 72 beats per minute, questionable old inferior-wall myocardial infarction.   ASSESSMENT AND PLAN: The patient was in atrial fibrillation and was given a Cardizem drip and converted to sinus rhythm. Her echocardiogram revealed ejection  fraction 30% to 35%,  mildly dilated left atrium, mildly dilated right atrium, severe tricuspid regurgitation, mild mitral regurgitation, mild aortic sclerosis without stenosis. Thus, the patient has severe left ventricular dysfunction. The question is, is the patient compliant with her medications. She apparently had a negative cardiac catheter in the past. Her troponin was only mildly elevated at 0.17 which can be due to renal failure. Advise continuation of aspirin and increase Coreg to improve left ventricular  function from 6.25 to 12.5 down the road.   We will continue to follow with you and will have to be followed in the office also. 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   As far as anticoagulants are concerned, because of renal failure and high risk of bleeding, advise aspirin. She has CHADS score of 1.   ____________________________ Laurier NancyShaukat A. Shenea Giacobbe, MD sak:np D: 07/20/2013 16:06:11 ET T: 07/20/2013 17:18:57 ET JOB#: [id number]  cc: Laurier NancyShaukat A. Semaj Kham, MD, <Dictator> Laurier NancySHAUKAT A Ericah Scotto MD ELECTRONICALLY SIGNED 07/25/2013 12:15

## 2014-09-02 NOTE — Consult Note (Signed)
In light of recent cardiac event, would rather proceed with barium swallow 1st even though cardiac cath neg. Thus, will cancel EGD for today and proceed with barium swallow. THanks.  Electronic Signatures: Lutricia Feilh, Ginnifer Creelman (MD)  (Signed on 11-May-15 09:31)  Authored  Last Updated: 11-May-15 09:31 by Lutricia Feilh, Samaya Boardley (MD)

## 2014-09-02 NOTE — Consult Note (Signed)
Chief Complaint and History:  Referring Physician Dr. Elpidio AnisSudini   Chief Complaint Hypoglycemia   Allergies:  No Known Allergies:   Assessment/Plan:  Assessment/Plan 49 yo F with ESRD on HD here with N/V and weight loss, found to have repeted low blood sugars on fingerstick blood sugar checks over the last 24 hours. Not receiving insulin or other hypoglycemia causing medications. No h/o diabetes. Deneis symptoms of hypoglycemia including weakness, sweats, hunger, or heart racing. Feels she is in her usual  state of health. Not eating much. Attributes this to her dislike of the purred diet. She reports a 150 lb weight loss over last 18 months. Max weight was 336 lbs.  Pt was interviewed, examined, and chart reviewed. A1c is 4.6%. Sugars range 32-133 in last 24 hrs.  A/ Low sugars on fingerstick testing -- causes of hypoglycemia may be poor glucose reserves in setting of malnutrition/ESRD/severe weight loss, erroneous as is often seen in dialysis patients with PVD, exposure to hypoglycemia causing medications, or due to hyperinsulinemic hypoglycemia as is more common in post-bypass patients after significant weight loss.  P/ DC IV Dextrose. Continue FSBS q2 hours Once FSBS <60, then draw a serum glucose to confirm low.  If asymptomatic with BG <60, then treat with 4 oz juice. If FBSS <40 and/or symptoms, then draw glucose, cortisol, c-peptide and insulin and then treat with 1 amp D50. OK to consider restarting the IV dextrose only if she has symptoms or having severe lows with sugar <40.   Full dictated consult to follow.   Electronic Signatures: Raj JanusSolum, Nasha Diss M (MD)  (Signed 15-May-15 17:13)  Authored: Chief Complaint and History, ALLERGIES, Assessment/Plan   Last Updated: 15-May-15 17:13 by Raj JanusSolum, Alanah Sakuma M (MD)

## 2014-09-02 NOTE — Consult Note (Signed)
PATIENT NAME:  Veronica Gordon, Veronica Gordon MR#:  [id number] DATE OF BIRTH:  1965-10-03  DATE OF CONSULTATION:  09/15/2013  REFERRING PHYSICIAN:   CONSULTING PHYSICIAN:  Laurier NancyShaukat A. Tamecca Artiga, MD  INDICATION FOR CONSULTATION: Chest pain, palpitations, elevated troponin and abnormal stress test in the office done last week.  HISTORY OF PRESENT ILLNESS: This is a 49 year old African American female with a past medical history of end-stage renal disease on hemodialysis 3 times a week, who presented to the Emergency Room with chest pain. The patient was supposed to be set up for outpatient cardiac catheterization and because stress test showed anteroseptal wall reversible defect; however in the meantime, the patient was admitted to the hospital with chest pain. The chest pain was described as pressure-type associated with shortness of breath and diaphoresis. She had an elevated troponin, thus I was asked to evaluate the patient.   PAST MEDICAL HISTORY: History of end-stage renal disease, history of hypertension and history of depression.   ALLERGIES: MIDAZOLAM.  MEDICATIONS: Amiodarone, aspirin and carvedilol   SOCIAL HISTORY: She smokes about 2 to 3 cigarettes a day. No EtOH abuse.   FAMILY HISTORY: Positive for end-stage renal disease.   PHYSICAL EXAMINATION:  GENERAL: She is alert and oriented x 3, in mild distress due to shortness of breath and chest pain.  VITAL SIGNS: Stable.  NECK: Positive JVD.  LUNGS: There is crepitation at the bases.  HEART: Regular rate and rhythm. Normal S1, S2. No audible murmur.  ABDOMEN: Soft, nontender, positive bowel sounds.  EXTREMITIES: No pedal edema.  NEUROLOGIC: She appears to be intact.   LABS/STUDIES: EKG shows sinus tachycardia at 117 beats per minute, nonspecific ST-T changes. Her labs shows white count of 7.9, hemoglobin 10, hematocrit 31. Troponin is 0.7.   ASSESSMENT AND PLAN: The patient has chest pain with class III angina and had a stress test, which was  abnormal in the office. Lexiscan Myoview showed anteroseptal wall reversible defect. She was being set up as an outpatient cardiac catheterization because of abnormal stress test and chest pain. In the meantime, we are going to admit now with non-STEMI. She also has a history of being on dialysis. She got dialysis today, but will set up for cardiac catheterization and do dialysis again tomorrow.   Thank you very much for the referral.  ____________________________ Laurier NancyShaukat A. Cornie Mccomber, MD sak:aw D: 09/15/2013 12:56:16 ET T: 09/15/2013 13:06:54 ET JOB#: [id number]  cc: Laurier NancyShaukat A. [place] Lingenfelter, MD, <Dictator> Laurier NancySHAUKAT A Aniceto Kyser MD ELECTRONICALLY SIGNED 10/05/2013 13:59

## 2014-09-02 NOTE — Consult Note (Signed)
Chief Complaint:  Subjective/Chief Complaint We were re-consulted for diarrhea/colitis.  Pt had mild colitis on recent CT.  Pt had 1 loose BM in past 24 hrs.  Se has 5/10 mild bilateral lower abdominal pain.  Good appetite.  Denies nausea or vomiting.  C diff negative.   VITAL SIGNS/ANCILLARY NOTES: **Vital Signs.:   10-Jun-15 08:51  Temperature Temperature (F) 97.5  Celsius 36.3  Pulse Pulse 71  Respirations Respirations 17  Systolic BP Systolic BP 90  Diastolic BP (mmHg) Diastolic BP (mmHg) 70  Mean BP 76  Pulse Ox % Pulse Ox % 94  Pulse Ox Activity Level  At rest  Oxygen Delivery Room Air/ 21 %   Brief Assessment:  GEN well developed, well nourished, no acute distress, A/Ox3   Cardiac Regular   Respiratory normal resp effort   Gastrointestinal Normal   Gastrointestinal details normal Soft  Nondistended  Bowel sounds normal  No gaurding  No rigidity  +mild TTP bilat lower abd   EXTR negative cyanosis/clubbing, negative edema   Additional Physical Exam Skin: warm, dry, intact   Lab Results: Routine Micro:  06-Jun-15 07:50   Micro Text Report BLOOD CULTURE   COMMENT                   NO GROWTH IN 48 HOURS   ANTIBIOTIC                       Culture Comment NO GROWTH IN 48 HOURS  Result(s) reported on 17 Oct 2013 at 08:00AM.    08:05   Micro Text Report BLOOD CULTURE   COMMENT                   NO GROWTH IN 48 HOURS   ANTIBIOTIC                       Culture Comment NO GROWTH IN 48 HOURS  Result(s) reported on 17 Oct 2013 at 08:00AM.  08-Jun-15 21:46   Micro Text Report STOOL COMPREHENSIVE   COMMENT                   COLONIES TOO SMALL TO READ   COMMENT                   NO PATHOGENIC E.COLI DETECTED   COMMENT                   NO CAMPYLOBACTER ANTIGEN DETECTED   ANTIBIOTIC                        Micro Text Report WBCS, STOOL   COMMENT                   NO WBC'S SEEN   COMMENT                   0-5/OIL IMMERSION FIELD RBC'S SEEN   ANTIBIOTIC                        Micro Text Report CLOSTRIDIUM DIFFICILE   C.DIFFICILE ANTIGEN       C.DIFFICILE GDH ANTIGEN : POSITIVE   C.DIFFICILE TOXIN A/B     C.DIFFICILE TOXINS A AND B : NEGATIVE   PCR FOR TOXIGENIC C.DIFF  PCR FOR TOXIGENIC C.DIFFICILE : NEGATIVE   INTERPRETATION Negative for toxigenic  C. difficile. Toxin gene and  active toxin production not detected. May be a nontoxigenic strain of C. difficile bacteria present, lacking the ability to produce toxin.    ANTIBIOTIC                        Culture Comment COLONIES TOO SMALL TO READ  Culture Comment . NO PATHOGENIC E.COLI DETECTED  Culture Comment    . NO CAMPYLOBACTER ANTIGEN DETECTED  Result(s) reported on 18 Oct 2013 at 03:20PM.  Comment .1. NO WBC'S SEEN  Comment .2. 0-5/OIL IMMERSION FIELD RBC'S SEEN  Result(s) reported on 19 Oct 2013 at 08:40AM.   Assessment/Plan:  Assessment/Plan:  Assessment Colitis:  Mild colitis on CT .  Likely ischemic colitis which would be self limited, cannot r/o infectious colitis or less likely inflammatory bowel disease.  Pt states last colonoscopy normal 2013 in [place].  Diarrhea minimal at this point.  C diff negative.  She is on antibiotics currently.   Plan 1) Follow up stool studies 2) Continue antibiotics 3) Supportive measures 4) If diarrhea persists, consider flexible sigmoidoscopy Please call if you have any questions or concerns   Electronic Signatures: Joselyn Arrow (NP)  (Signed 10-Jun-15 11:53)  Authored: Chief Complaint, VITAL SIGNS/ANCILLARY NOTES, Brief Assessment, Lab Results, Assessment/Plan   Last Updated: 10-Jun-15 11:53 by Joselyn Arrow (NP)

## 2014-09-02 NOTE — Consult Note (Signed)
Pt has pain in chest with swallowing and feeling of food hanging up in mid lower esophagus.  Will schedule EGD with dilatation with Dr. Bluford Kaufmannh for tomorrow.  She dialyzies Tue Thur Sat.  Abd exam with epigastric tenderness.  Electronic Signatures: Scot JunElliott, Robert T (MD)  (Signed on 10-May-15 12:31)  Authored  Last Updated: 10-May-15 12:31 by Scot JunElliott, Robert T (MD)

## 2014-09-02 NOTE — Discharge Summary (Signed)
Dates of Admission and Diagnosis:  Date of Admission 15-Sep-2013   Date of Discharge 24-Sep-2013   Admitting Diagnosis SOB   Final Diagnosis 1. Esophageal stricture/dysphagia 2. Bilateral PNA 3. Sepsis 4. Elevtaed troponin. Normal Cath 5. ESRD 6. AOCD 7. Anxiety 8. Hypoglycemia    Chief Complaint/History of Present Illness PRIMARY CARE PHYSICIAN: Dr. Brynda Greathouse   HISTORY OF PRESENT ILLNESS: The patient is a 49 year old African American female with history of endstage renal disease with hemodialysis Tuesday, Thursday, and Saturday who presented to the hospital with complaints of significant chest pain. According to the patient, she was doing well up until approximately a week ago when she started having chest pain which she describes as heartburn. She tells me that she needs to eat very slow as when she tries to eat quickly, when food travels down the throat, it burns and it is very uncomfortable, very painful for her. Usually pain increases whenever she tries to eat. She also tells me that she has been intermittently nauseated and vomiting and has some epigastric abdominal pains. She has been losing weight, approximately 150 pounds in the past 1-1/2 years. She denies any feeling of food getting stuck in the esophagus. She had EGD done approximately 2 years ago which revealed peptic ulcer disease. That was done in [place]. On arrival to the Emergency Room, she was noted to have elevated troponin and hospitalist services were contacted for admission. Her EKG is unremarkable with T depressions in high lateral leads, however, no significant difference since prior EKG done in March 2015. The patient was recently admitted for A-fib, RVR, nausea and vomiting due to gastritis, hyperkalemia, pruritus.   Allergies:  No Known Allergies:   Hepatic:  16-May-15 09:59   Albumin, Serum  1.8  General Ref:  16-May-15 08:41   Cortisol, Serum RIA ========== TEST NAME ==========  ========= RESULTS =========  =  REFERENCE RANGE =  CORTISOL  Cortisol Cortisol                        [   15.5 ug/dL           ]          2.3-19.4                                        Cortisol AM         6.2 - 19.4                                        Cortisol PM         2.3 - 11.9               [facility]            No: [id number]           [address], [place], [place] [address]           Lindon Romp, MD         [phone]   Result(s) reported on 25 Sep 2013 at 10:48PM.  Insulin + C-Peptide ========== TEST NAME ==========  ========= RESULTS =========  = REFERENCE RANGE =  INSULIN + C-PEPTIDE  Insulin and C-Peptide, Serum Insulin                         [  L  2.2 uIU/mL           ]          2.6-24.9 C-Peptide, Serum                [H 4.5 ng/mL            ]           1.1-4.4 C-Peptide reference interval is for fasting patients.               [facility]            No: [id number]           [address] [place], [place] [address]           Lindon Romp, MD    [phone]   Result(s) reported on 26 Sep 2013 at 01:19PM.    09:59   HBsAg ========== TEST NAME ==========  ========= RESULTS =========  = REFERENCE RANGE =  HEPATITIS B SURFACE AG  HBsAg Screen HBsAg Screen                    [   Negative             ]          Negative               [facility]            No: [id number]           [address] [place], [place] [address]           Lindon Romp, MD         [phone]   Result(s) reported on 25 Sep 2013 at 10:48PM.  Hepatitis B Core Antibody Total ========== TEST NAME ==========  ========= RESULTS =========  = REFERENCE RANGE =  HEPATITIS B CORE AB,TOTL  Hep B Core Ab, Tot Hep B Core Ab, Tot              [   Negative             ]          Negative               [facility]      No: [id number]           [address], [place], [place] [address]           Lindon Romp, MD         [phone]   Result(s) reported  on 26 Sep 2013 at 08:46AM.    14:49   Cortisol, Serum RIA ========== TEST NAME ==========  ========= RESULTS =========  = REFERENCE RANGE =  CORTISOL  Cortisol Cortisol                        [H  20.6 ug/dL           ]          2.3-19.4                                        Cortisol AM         6.2 - 19.4  Cortisol PM         2.3 - 11.9               [facility]            No: [id number]           [address] [place], [place] [address]           Lindon Romp, MD         [phone]   Result(s) reported on 25 Sep 2013 at 10:48PM.  Cardiology:  15-May-15 11:12   Ventricular Rate 74  Atrial Rate 74  P-R Interval 192  QRS Duration 98  QT 440  QTc 488  P Axis 52  R Axis -22  T Axis 47  ECG interpretation Normal sinus rhythm Possible Left atrial enlargement Nonspecific ST and T wave abnormality Prolonged QT Abnormal ECG When compared with ECG of 15-Sep-2013 08:35, T wave inversion less evident in Lateral leads Confirmed by MORAYATI, SAM ([id number]) on 09/26/2013 11:37:46 AM  Overreader: Hipolito Bayley  Routine Chem:  15-May-15 11:20   Result Comment TROPONIN - RESULTS VERIFIED BY REPEAT TESTING.  - NOTIFIED OF ELEVATED RESULT Villa Herb, RN AT 5035 ON 09/23/13 SAL.  - READ-BACK PROCESS PERFORMED.  Result(s) reported on 23 Sep 2013 at 12:34PM.    14:35   Result Comment TROPONIN - PREVIOUSLY CALLED ELEVATED RESULT TO  Villa Herb, RN AT 1229 ON 09/23/13  - BY SAL. SAL.  Result(s) reported on 23 Sep 2013 at 03:26PM.  16-May-15 08:41   Glucose, Serum  51 (Result(s) reported on 24 Sep 2013 at 09:32AM.)    09:59   Glucose, Serum 82  BUN  19  Creatinine (comp)  5.22  Sodium, Serum 140  Potassium, Serum  3.1  Chloride, Serum 102  CO2, Serum 32  Calcium (Total), Serum  7.5  Phosphorus, Serum  1.7  Phosphorus, Serum  1.9 (Result(s) reported on 24 Sep 2013 at 11:20AM.)  Anion Gap  6  Osmolality (calc) 281  eGFR  (African American)  10  eGFR (Non-African American)  9 (eGFR values <2m/min/1.73 m2 may be an indication of chronic kidney disease (CKD). Calculated eGFR is useful in patients with stable renal function. The eGFR calculation will not be reliable in acutely ill patients when serum creatinine is changing rapidly. It is not useful in  patients on dialysis. The eGFR calculation may not be applicable to patients at the low and high extremes of body sizes, pregnant women, and vegetarians.)    14:49   Glucose, Serum  62 (Result(s) reported on 24 Sep 2013 at 03:34PM.)  Cardiac:  15-May-15 11:20   Troponin I  0.06 (0.00-0.05 0.05 ng/mL or less: NEGATIVE  Repeat testing in 3-6 hrs  if clinically indicated. >0.05 ng/mL: POTENTIAL  MYOCARDIAL INJURY. Repeat  testing in 3-6 hrs if  clinically indicated. NOTE: An increase or decrease  of 30% or more on serial  testing suggests a  clinically important change)    14:35   Troponin I  0.07 (0.00-0.05 0.05 ng/mL or less: NEGATIVE  Repeat testing in 3-6 hrs  if clinically indicated. >0.05 ng/mL: POTENTIAL  MYOCARDIAL INJURY. Repeat  testing in 3-6 hrs if  clinically indicated. NOTE: An increase or decrease  of 30% or more on serial  testing suggests a  clinically important change)  Routine Hem:  15-May-15 04:23   Platelet Count (CBC) 154 (Result(s) reported on 23 Sep 2013 at 05:01AM.)  16-May-15 09:59  WBC (CBC) 5.2  RBC (CBC)  3.71  Hemoglobin (CBC)  9.8  Hematocrit (CBC)  31.1  Platelet Count (CBC) 188  MCV 84  MCH 26.5  MCHC  31.6  RDW  18.5  Neutrophil % 73.9  Lymphocyte % 15.4  Monocyte % 8.5  Eosinophil % 1.3  Basophil % 0.9  Neutrophil # 3.9  Lymphocyte #  0.8  Monocyte # 0.4  Eosinophil # 0.1  Basophil # 0.0 (Result(s) reported on 24 Sep 2013 at 11:30AM.)   Pertinent Past History:  Pertinent Past History PAST MEDICAL HISTORY: Significant for end-stage renal disease with hemodialysis on Tuesdays, Thursdays, and  Saturdays. Last hemodialysis was Tuesday. History of hypertension, however, the patient is not taking any blood pressure medications and her blood pressure was in the 100s recently. History of atrial fibrillation. A that time, she had cardiac catheterization, which was unremarkable. The patient did have echocardiogram done which revealed decreased cardiac function. Hypertension, history of MRSA, history of depression and anxiety and diabetes. However, the patient reported that apparently her sugars have been abnormally low with hypoglycemia.   Hospital Course:  Hospital Course * CP Pleuritic. Likely from PNA or her esophageal stricture. Had normal cath. Pt does not have ACS and can go to EGD when scheduled.  * Hypoglycemia was On D10. Says she has hypoglycemia if she doesnt eat. Insulin, cortisol normal. D10 stopped, diet started and blood glucose > 70 at d/c.   * Dysphagia Significant stricture on Barium swallow. Will need EDG. Pt could not get EGD in hspital as it had to be rescheduled multiple times due to Hypoglycemia or chest pain or scheduling. Pt will be gettng EGD as OP with Dr. Candace Cruise. Tolerating food and tabs.  * Sepsis due to Bilateral PNA, Pseudomonas On Levaquin  * Elevated troponin, chest pain, hx cardiomyopathy - cardiac cath negative - asa  * ESRD on hd as per nephrology  * Anemia - iron, procrit  * Anxiety/ depression - no changes in meds  On day of discharge Alert and oriented times 3 S1, S2 Lungs CTA Abd soft, NT, BS present  Time spent on d/c 40 minutes   Condition on Discharge Fair   Code Status:  Code Status Full Code   DISCHARGE INSTRUCTIONS HOME MEDS:  Medication Reconciliation: Patient's Home Medications at Discharge:     Medication Instructions  ropinirole 1 mg tablet  1 tab(s) orally once a day (at bedtime)    trazodone 100 mg tablet  1 tab(s) orally once a day (at bedtime)    wellbutrin xl 300 mg/24 hours oral tablet, extended release   1 tab(s) orally once a day (at bedtime)   cymbalta 60 mg oral delayed release capsule  1 cap(s) orally once a day (at bedtime)   lorazepam 1 mg tablet  1 tab(s) orally once a day   sensipar 60 mg oral tablet  1 tab(s) orally once a day   gabapentin 600 mg oral tablet  1 tab(s) orally once a day (at bedtime)   zofran 8 mg oral tablet  1 tab(s) orally once a day, As Needed   amiodarone 200 mg oral tablet  1 tab(s) orally once a day   aspirin 325 mg oral delayed release tablet  1 tab(s) orally once a day   hydroxyzine hydrochloride 10 mg oral tablet  1 tab(s) orally 3 times a day as needed for itching   ferrous sulfate 325 mg (65 mg elemental iron) oral tablet  1 tab(s) orally 2  times a day (with meals)   phoslo gelcap 667 mg oral capsule  2  orally 2 times a day   sevelamer carbonate 0.8 g oral powder for reconstitution  2 packet(s) orally 2 times a day (with meals)   calcium acetate 667 mg/5 ml oral liquid  10 milliliter(s) orally 3 times a day (with meals)   nystatin 100,000 units/ml oral suspension  5 milliliter(s) orally every 6 hours   levofloxacin 500 mg oral tablet  1 tab(s) orally every 48 hours   pantoprazole 40 mg oral delayed release tablet  1 tab(s) orally 2 times a day   acetaminophen-oxycodone 325 mg-5 mg oral tablet  1 tab(s) orally every 8 hours, As Needed, pain , As needed, pain   metoclopramide 5 mg oral tablet  1 tab(s) orally 3 times a day (before meals)    STOP TAKING THE FOLLOWING MEDICATION(S):    carvedilol 12.5 mg oral tablet: 1 tab(s) orally once a day (at bedtime)  Physician's Instructions:  Diet Renal Diet  soft   Activity Limitations As tolerated   Return to Work Not Applicable   Time frame for Follow Up Appointment 1-2 weeks  Dr. Candace Cruise   Time frame for Follow Up Appointment 1-2 weeks  Dr. Holley Raring   Time frame for Follow Up Appointment 1-2 weeks  PCP   Other Comments Dr. Candace Cruise will call you with appt for endoscopy   Electronic Signatures: Alba Destine (MD)  (Signed 20-May-15 12:37)  Authored: ADMISSION DATE AND DIAGNOSIS, CHIEF COMPLAINT/HPI, Allergies, PERTINENT LABS, [place], PATIENT INSTRUCTIONS   Last Updated: 20-May-15 12:37 by Alba Destine (MD)

## 2014-09-02 NOTE — Consult Note (Signed)
PATIENT NAME:  Veronica Gordon, Veronica Gordon MR#:  [id number] DATE OF BIRTH:  May 05, 1966  DATE OF CONSULTATION:  12/05/2013  REFERRING PHYSICIAN:  Cheral Marker. Ola Spurr, MD CONSULTING PHYSICIAN:  A. Lavone Orn, MD  CHIEF COMPLAINT: Hypoglycemia.   HISTORY OF PRESENT ILLNESS: This is a 49 year old female with multiple medical problems including AIDS, disseminated mycobacterial infection, end-stage renal disease, severe malnutrition with anorexia, and history of odynophagia with known esophageal ulcers, who was admitted on July 22 with severe symptomatic anemia. She had a prior hospitalization for approximately 1 month, with discharge on July 14. During that hospitalization, I was involved in her care due to severe recurrent hypoglycemia. She had had recurrent low fingerstick blood sugar testing in the 40s to 50s as well as serum blood sugars in the 50s. She had had very poor p.o. intake. On May 14 and May 16 she had cortisol levels which were shown to be normal, and there was no evidence of adrenal insufficiency. She had been started on Megace to boost her appetite. She had continued to require dextrose up until the time of discharge. She re-presented as mentioned above, on July 22nd, with symptomatic anemia. Since admission, she has again had periodic episodes of hypoglycemia including blood sugars in the 40s to 50s every day since admission. Today, blood sugars have been better and in the 75-100 range. She is on D10 at a rate of 150 mL/h. The patient was seen in her hospital room. She is a poor historian. She is unable to explain why she continues to not eat. At this time, she denies odynophagia.   MEDICAL HISTORY:  1.  HIV/AIDS.  2.  Disseminated mycobacterial infection.  3.  End-stage renal disease on hemodialysis.  4.  CMV viremia.  5.  Anemia of chronic disease.  6.  Esophageal ulcers and duodenitis.  7.  Esophageal stricture.  8.  Atrial fibrillation.  9.  Hypertension.  10.  History of MRSA.  11.   Anxiety and depression.  12.  Thrombocytopenia.   SURGICAL HISTORY: Dialysis fistula.   SOCIAL HISTORY: The patient was admitted from a rehabilitation facility. No tobacco use.   FAMILY HISTORY: No known hypoglycemia.   ALLERGIES: No known drug allergies.   MEDICATIONS: 1.  Megace 400 mg/10 mL b.i.d.  2.  Clarithromycin 500 mg q. 12 hours.  3.  Myambutol 1200 mg q. 72 hours.  4.  Hydralazine 10 mg q.i.d.  5.  Abilify 5 mg daily.  6.  Isoniazid 300 mg daily.  7.  Lamivudine 25 mg q. 24 hours.  8.  Ritalin 10 mg each morning.  9.  Remeron 15 mg at bedtime.  10.  Protonix 40 mg q. 12 hours.  11.  B6, 50 mg daily.  12.  Requip 1 mg at bedtime.  13.  Ensure shakes t.i.d.  REVIEW OF SYSTEMS: Unable to obtain, as patient does not answer most questions asked.   PHYSICAL EXAMINATION:  VITAL SIGNS: Height 66.9 inches, weight 166 pounds, temperature 98.4, pulse 99, respirations 18, blood pressure 123/88.  GENERAL: Chronically ill-appearing African American female in no acute distress.  HEENT: No proptosis. EOMI. Mucous membranes appear dry.  NECK: Supple.  CARDIAC: Tachycardic. No carotid bruit.  PULMONARY: Clear to auscultation bilaterally.  ABDOMEN: Diffusely soft, nontender. Hypoactive bowel sounds.  EXTREMITIES: Diffuse edema 2+ is present.  NEUROLOGIC: Alert. Answers minimal questions. Able to track my face. No apparent dysarthria. PSYCHIATRIC: Calm.  SKIN: No rash visualized.   LABORATORY: May 14 at 9:40 a.m.  cortisol level 17.2, hemoglobin A1c 5.4%. May 16 at 8:50 a.m. cortisol level 15.5. May 16 at 2:50 p.m. cortisol level 20.6. July 26 at 2:30 p.m. cortisol 30. July 27, glucose 95, BUN 19, creatinine  4.1, sodium 128, chloride 94, eGFR 14, hemoglobin 7.0, hematocrit 21.9, WBC 3.5, platelets 40,000.   ASSESSMENT: A 49 year old female with severe chronic illness and multiple acute medical problems with severe recurrent hypoglycemia attributed to her severe malnutrition and  anorexia. There is no evidence of adrenal insufficiency.   RECOMMENDATIONS:  1.  Continue to encourage good p.o. nutrition. Discussed with nursing, who have assisted her with feeds t.i.d. Additionally, have spoken with family on prior admissions, and they typically bring in foods that that she prefers from outside the hospital and encourage her to try eat that as well. She is not giving much information at this time as to what dietary preferences she might have.  2.  Continue Megace.  3.  Consider holding Ritalin, as this can decrease appetite.  4.  As no evidence of adrenal insufficiency, there is no role for glucocorticoids.  5.  Will recommend decreasing dextrose to target blood sugar 60 or higher. There is minimal risk if her blood sugar drops in the 60s, and given end-stage renal disease, we should try and avoid excessive fluids. Will decrease her IV dextrose at this time to 90 mL/h, and change fingerstick blood sugars to q. 4 hours to target blood sugar over 60. If blood sugar is below 60, could give 1 amp dextrose or juice, if tolerated.   Thank you for the kind request for consultation.    ____________________________ A. Lavone Orn, MD ams:sk D: 12/05/2013 22:27:33 ET T: 12/06/2013 03:09:04 ET JOB#: [id number]  cc: A. Lavone Orn, MD, <Dictator> Sherlon Handing MD ELECTRONICALLY SIGNED 12/08/2013 14:12

## 2014-09-02 NOTE — Consult Note (Signed)
BG low again this AM and trending down overnight. Just given D50. Pt to go to HD at [facility]. Anesthesia have orthopedic surgery also @ 9 AM. Will have to cancel EGD again. Anesthesia recommends BG ABOVE 80 for patient to get sedation. Full liquid diet ordered. DO frequent BG's. Keep BG above 80!  Will try EGD again on Monday afternoon. Since patient will have to be NPO after MN on Sunday, make sure patient is on D5 or D10 via IVF so that BG will not fall prior to EGD on Monday afternoon.  thanks   Electronic Signatures: Lutricia Feilh, Inola Lisle (MD) (Signed on 16-May-15 08:31)  Authored   Last Updated: 16-May-15 09:19 by Lutricia Feilh, Marvell Tamer (MD)

## 2014-09-02 NOTE — Consult Note (Signed)
Psychiatry: Follow-up note for this 49 year old woman with multiple medical problems including renal failure infections and AIDS.  I had seen her previously because of concern about depression.  Today the patient presents as significantly more delirious than I had seen her before.  She was barely able to cooperate with the interview.  Mental status was waxing and waning.  She seemed to be only intermittently aware of my presence although at times she made an attempt to appropriately interact.  She complained of wanting more blankets on her but then denied feeling cold.  She indicated she had pain in her buttocks but then was unable to follow-up with questions about it.  She seemed to be rambling and confused with waxing and waning mental state.  Unable to always consistently answer questions.  She was not able to identify where she was or why she was here.  Affect seemed confused and a little blunted.  She did not appear to be suicidal.  Just appeared to be delirious. with multiple severe medical problems with multiple reasons to become delirious not least of which would be the HIV disease itself.  I have elected to add a low dose of Abilify as an antipsychotic to try and help with her confusion and delirium.  If her behavior appears to be threatening her care or making her uncomfortable I would add a low dose of when necessary Haldol either by mouth or IV.  I will be out of the hospital for the next week.  Please consult the psychiatrist on call if further evaluation and assistance is necessary.  Electronic Signatures: Audery Amellapacs, Shrika Milos T (MD)  (Signed on 29-Jun-15 21:58)  Authored  Last Updated: 29-Jun-15 21:58 by Audery Amellapacs, Andilyn Bettcher T (MD)

## 2014-09-02 NOTE — Consult Note (Signed)
PATIENT NAME:  Veronica Gordon, Veronica Gordon MR#:  [id number] DATE OF BIRTH:  06-26-1965  GASTROINTESTINAL CONSULTATION   DATE OF CONSULTATION:  12/09/2013  REFERRING PHYSICIAN:  Dr. Manuella Ghazi. CONSULTING PHYSICIAN:  Andria Meuse, NP  CONSULTING GASTROENTEROLOGIST: Lucilla Lame, MD  INFECTIOUS DISEASE SPECIALIST: Cheral Marker. Ola Spurr, MD  REASON FOR CONSULTATION: Hyperbilirubinemia.   HISTORY OF PRESENT ILLNESS: Veronica Gordon is a 49 year old female who has been hospitalized in June and July here at [facility]. She had newly re-diagnosed HIV and had restarted therapy for that as well as MAI. She was just discharged yesterday (please refer to discharge summary per hospitalist). We had seen her during her first hospitalization for esophageal stricture and chronic diarrhea. She tells me the chronic diarrhea is much better. Her swallowing is much better. She is having chest pain and epigastric pain. She is on PPI. She was readmitted with pancytopenia, felt to be anemia due to her medications. She denies any jaundice or pruritus. She denies any fever or chills. CT scan of abdomen and pelvis is pending. Alkaline phosphatase was 127, AST is 95, ALT is 18; from 11/29/2013, show total bilirubin 0.9, alkaline phosphatase 302, AST 44 and ALT 20.   PAST MEDICAL AND SURGICAL HISTORY: Candida esophagitis with EGD in July 2015 with Dr. Allen Norris. HIV, followed by Dr. Ola Spurr and previously [facility]. Disseminated MAI. History of MRSA, anxiety, depression, malnutrition, pancytopenia, elevated CMV, esophageal ulcers and duodenitis, anemia of chronic disease, end-stage kidney disease, on hemodialysis, dialysis fistula, HIV-induced dementia.  MEDICATIONS PRIOR TO ADMISSION: 1. Abilify 5 mg at bedtime. 2. Aspirin 81 mg daily. 3. Benzonatate 100 mg q.4 hours p.r.n. 4. Calcium/vitamin D 500/200 b.i.d.  5. Clarithromycin 500 mg b.i.d. 6. Dexamethasone 4 mg b.i.d.  7. Ethambutol 400 mg 3 tablets at bedtime on Tuesday,  Thursday, Saturday.  8. Gabapentin 600 mg at bedtime. 9. Isentress 400 mg b.i.d.  10. Isoniazid 300 mg daily. 11. Lamivudine 10 mg/mL 2.5 mL at bedtime.  12. Loperamide 2 mg q.i.d. p.r.n. 13. Megace 40 mg/mL 10 mL b.i.d. 14. Nicotrol inhaler 10 mg q.4 hours p.r.n. 15. Nystatin 100,000 units 5 mL every 6 hours. 16. Pantoprazole 40 mg daily. 17. PhosLo Gelcaps 667 mg 2 caps b.i.d. 18. Remeron 15 mg at bedtime.  19. Rifampin 300 mg 2 caplets daily. 20. Ropinirole 1 mg tablet at bedtime.  21. Trazodone 100 mg at bedtime. 22. Tylenol 325 mg 2 tabs q.4 hours p.r.n. 23. Viread 300 mg at bedtime. 24. Zofran 8 mg daily p.r.n.   ALLERGIES: No known drug allergies.   FAMILY HISTORY: Unable to obtain. Review of records states there is no family history of colorectal carcinoma, liver or chronic GI problems.  SOCIAL HISTORY: The patient resides in a skilled nursing facility. There is no alcohol, tobacco or illicit drug use.   REVIEW OF SYSTEMS: See HPI.  CONSTITUTIONAL: She has fatigue and malaise.  PSYCHIATRIC: She has had recent mental status changes and forgetfulness.  MUSCULOSKELETAL: She has had weakness in extremities.  Otherwise, negative 12-point review of systems.   PHYSICAL EXAMINATION:  VITAL SIGNS: Temperature 97.9, pulse 96, respirations 18, blood pressure 126/101, O2 saturation 96% on room air.  GENERAL: She is alert, cooperative. No acute distress.  HEENT: Sclerae clear, nonicteric. Conjunctivae pale. Oropharynx pink and moist without any lesions.  NECK: Supple, without any mass or thyromegaly.  CHEST: Heart: Regular rate and rhythm. Normal S1, S2, without any murmurs, clicks, rubs or gallops.  LUNGS: With decreased breath sounds bilaterally. No  acute distress.  ABDOMEN: Positive bowel sounds x4. No bruits auscultated. Abdomen is soft, nondistended. She has mild epigastric tenderness on deep palpation. There is no rebound, tenderness or guarding.  EXTREMITIES: Without edema or  cyanosis.  SKIN: Warm and dry, without any rash or jaundice.  PSYCHIATRIC: Alert and oriented x2, cooperative.   LABORATORY STUDIES: See HPI. Glucose 61, creatinine 2.85, sodium 128, chloride 94, calcium 7.1, otherwise normal MET-7. Lipase 260. Total protein 5, albumin 1.2, troponin 0.06. White blood cell count 3.4, hemoglobin 10.6, hematocrit 32.8, platelets 61. INR 1.6. Acetaminophen is 8.   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 She has been having chest pain, epigastric pain, nausea, vomiting and hyperbilirubinemia, with a total bilirubin greater than 13.   She has been on raltegravir, lamivudine, tenofovir, rifampin, INH, ethambutol, fluconazole and clarithromycin, along with other potentially hepatotoxic drugs. We have checked an acetaminophen level which was okay. This is likely hepatocellular drug-induced injury. She has chronic chest and abdominal pain as well. I have discussed with Dr. Manuella Ghazi, ER physician and Dr. Allen Norris. She may benefit from tertiary care transfer to [facility] to optimize her antiviral/antibiotic therapy and minimize hepatotoxicity.   PLAN:  1. Await CT scan.  2. Infectious disease involvement will be needed to adjust culprit medications.  3. Recommend PPI b.i.d.  4. Direct bilirubin.   Thank you for allowing [place] to participate in her care.   ____________________________ Andria Meuse, NP klj:lb D: 12/09/2013 15:15:16 ET T: 12/09/2013 15:34:44 ET JOB#: [id number]  cc: Andria Meuse, NP, <Dictator> Andria Meuse FNP ELECTRONICALLY  SIGNED 12/24/2013 21:28

## 2014-09-03 NOTE — Consult Note (Signed)
General Aspect complication of av dialysis device    Present Illness The patient is a 49 year old female with a known history of endstage renal disease on hemodialysis, hypertension, and obesity who is being admitted for malignant hypertension. The patient was at Dr. Brynda Greathouse???s office where she was found to be anemic with hemoglobin of 7. She was requested to come to the Emergency Department for blood transfusion. While in the ED she was found to have malignant hypertension with a blood pressure of 213/108. She also indicated her dialysis graft has not been working on the arterial side since last Tuesday and has not been able to get dialysis. Her venous side functions well but the arterial side does not and she has some swelling and tenderness at the site.  She tried to coordinate as an outpatient but it did not work very well and came to the Emergency Department. She also indicates that her wound VAC is to be due to be changed tomorrow which is applied on the left buttock from her last admission. The patient denies any chest pain. She does have minimal shortness of breath. She denies any other symptoms at this time.   PAST MEDICAL HISTORY:  1. Endstage renal disease on hemodialysis at home.  2. Hypertension.  3. Obesity.   Home Medications: Medication Instructions Status  rOPINIRole 1 mg tablet 1 tab(s) orally once a day (at bedtime)  Active  traZODone 100 mg tablet 1 tab(s) orally once a day (at bedtime)  Active  metoclopramide 10 mg tablet 1 tab(s) orally every 6 hours as needed   Active  LORazepam 1 mg tablet 1 tab(s) orally 4 times a day Active  Wellbutrin XL 300 mg/24 hours oral tablet, extended release 1 tab(s) orally once a day (at bedtime) Active  Cymbalta 60 mg oral delayed release capsule 1 cap(s) orally once a day (at bedtime) Active  acetaminophen-oxycodone 325 mg-5 mg oral tablet 1-2 tab(s) orally every 4 hours, As Needed- for Pain  Active  MiraLax oral powder for reconstitution 17  gram(s) orally once a day Active  lactulose 10 g/15 mL oral syrup 30 milliliter(s) orally 2 times a day, As Needed- for Constipation  Active  PhosLo Gelcap 667 mg oral capsule 3  orally 3 times a day with meals Active  PhosLo Gelcap 667 mg oral capsule 2 cap(s) orally 2 times a day at hs with snack. Active  clonidine 0.1 mg oral tablet 1 tab(s) orally 2 times a day Active  lisinopril 10 mg oral tablet 1 tab(s) orally once a day Active    Midazolam: Agitation, Anxiety  Case History:   Family History Non-Contributory    Social History negative tobacco, negative ETOH, negative Illicit drugs   Review of Systems:   Fever/Chills No    Cough No    Sputum No    Abdominal Pain No    Diarrhea No    Constipation No    Nausea/Vomiting No    SOB/DOE No    Chest Pain No    Telemetry Reviewed NSR    Dysuria No   Physical Exam:   GEN WD, WN, NAD    HEENT hearing intact to voice, poor dentition    NECK supple  trachea midline    RESP normal resp effort  no use of accessory muscles    CARD regular rate  LE edema present  positive JVD    VASCULAR ACCESS AV fistula present  Good bruit  Good thrill    ABD denies  tenderness  soft    EXTR negative cyanosis/clubbing, negative edema    SKIN No rashes, No ulcers    NEURO cranial nerves intact, follows commands, motor/sensory function intact    PSYCH alert, A+O to time, place, person   Nursing/Ancillary Notes: **Vital Signs.:   14-Mar-13 16:26   Temperature Temperature (F) 98   Celsius 36.6   Temperature Source oral   Pulse Pulse 92   Respirations Respirations 20   Systolic BP Systolic BP 707   Diastolic BP (mmHg) Diastolic BP (mmHg) 867   Mean BP 130   Pulse Ox % Pulse Ox % 100   Oxygen Delivery Room Air/ 21 %   Nurse Fingerstick (mg/dL) FSBS (fasting range 65-99 mg/dL) 61   Cardiology:  14-Mar-13 12:24    Ventricular Rate 93   Atrial Rate 93   P-R Interval 166   QRS Duration 76   QT 390   QTc 484   P Axis  58   R Axis 12   T Axis 43  Cardiac:  14-Mar-13 13:21    CK, Total 63   CPK-MB, Serum < 0.5  Routine Chem:  14-Mar-13 13:21    Glucose, Serum 78   BUN 43   Creatinine (comp) 12.44   Sodium, Serum 138   Potassium, Serum 5.0   Chloride, Serum 98   CO2, Serum 26   Calcium (Total), Serum 8.8  Hepatic:  14-Mar-13 13:21    Bilirubin, Total 0.4   Alkaline Phosphatase 57   SGPT (ALT) 10   SGOT (AST) 16   Total Protein, Serum 8.1   Albumin, Serum 3.1  Routine Chem:  14-Mar-13 13:21    Osmolality (calc) 285   eGFR (African American) 4   eGFR (Non-African American) 3   Anion Gap 14  Routine Hem:  14-Mar-13 13:21    WBC (CBC) 5.1   RBC (CBC) 2.32   Hemoglobin (CBC) 6.8   Hematocrit (CBC) 21.1   Platelet Count (CBC) 200   MCV 91   MCH 29.3   MCHC 32.3   RDW 15.3  Cardiac:  14-Mar-13 13:21    Troponin I 0.02  Routine BB:  14-Mar-13 13:21    Antibody Screen NEGATIVE  Blood Glucose:  14-Mar-13 16:30    POCT Blood Glucose 61    16:44    POCT Blood Glucose 58    17:03    POCT Blood Glucose 67    17:45    POCT Blood Glucose 106  Routine UA:  14-Mar-13 20:44    Color (UA) Yellow   Clarity (UA) Cloudy   Bilirubin (UA) Negative   Ketones (UA) Negative   Specific Gravity (UA) 1.009   Blood (UA) Negative   pH (UA) 9.0   Nitrite (UA) Negative   Leukocyte Esterase (UA) Negative   RBC (UA) <1 /HPF   WBC (UA) 1 /HPF   Bacteria (UA) 1+   Epithelial Cells (UA) 30 /HPF   Mucous (UA) PRESENT     Impression 1.  Complication of AV dialysis device            the patient will need a fiatulagram with the hope for intervention 2.  End stage renal disease             plan per nephrology 3.  hypertension              continue meds as ordered             changes per medical service 4.  anemia             plan per medical service    Plan level 3   Electronic Signatures: Hortencia Pilar (MD)  (Signed 27-Mar-13 17:03)  Authored: General Aspect/Present Illness, Home  Medications, Allergies, History and Physical Exam, Vital Signs, Labs, Impression/Plan   Last Updated: 27-Mar-13 17:03 by Hortencia Pilar (MD)

## 2014-09-03 NOTE — Consult Note (Signed)
Impression: 49yo BF w/ h/o ESRD on home HD admitted with Methacillin Resistant Staph aureus left buttock abscess.  She has undergone I&D.  The wound is still draining enough to soak the bandages.  She is afebrile. Her WBC has come down.  Will recheck in am. Will change her to oral doxycycline.  This will not require renal dose adjustment.  Would treat for 10 days from I&D (through 07/04/11). Continue contact isolation.  Electronic Signatures: Seville Brick, Rosalyn GessMichael E (MD)  (Signed on 14-Feb-13 11:32)  Authored  Last Updated: 14-Feb-13 11:32 by Nike Southers, Rosalyn GessMichael E (MD)

## 2014-09-03 NOTE — H&P (Signed)
PATIENT NAME:  Veronica Gordon, Veronica Gordon MR#:  [id number] DATE OF BIRTH:  June 11, 1965  DATE OF ADMISSION:  07/24/2011  PRIMARY CARE PHYSICIAN:  Dr. Maryellen Pile SURGEON:  Dr. Egbert Garibaldi REQUESTING PHYSICIAN: Dr. Mindi Junker   CHIEF COMPLAINT: Weakness and dialysis fistula not working.   HISTORY OF PRESENT ILLNESS: The patient is a 49 year old female with a known history of endstage renal disease on hemodialysis, hypertension, and obesity who is being admitted for malignant hypertension. The patient was at Dr. Jake Church office where she was found to be anemic with hemoglobin of 7. She was requested to come to the Emergency Department for blood transfusion. While in the ED she was found to have malignant hypertension with a blood pressure of 213/108. She also indicated her dialysis graft has not been working on the arterial side since last Tuesday and has not been able to get dialysis. Her venous side functions well but the arterial side does not and she has some swelling and tenderness at the site.  She tried to coordinate as an outpatient but it did not work very well and came to the Emergency Department. She also indicates that her wound VAC is to be due to be changed tomorrow which is applied on the left buttock from her last admission. The patient denies any chest pain. She does have minimal shortness of breath. She denies any other symptoms at this time.   PAST MEDICAL HISTORY:  1. Endstage renal disease on hemodialysis at home.  2. Hypertension.  3. Obesity.   ALLERGIES: Midazolam.  SOCIAL HISTORY: She lives at home and at times she is in [place], [place]. She denies any alcohol. She has smoked 1 to 2 cigarettes daily for the last several years.   FAMILY HISTORY: Positive for mother with hemodialysis and congestive heart failure. One other family member had leukemia.    MEDICATIONS AT HOME:  1. Wellbutrin XL 300 mg p.o. daily.  2. Trazodone 100 mg p.o. at bedtime. 3. Ropinirole 1 mg p.o. daily.   4. PhosLo 3 capsules p.o. 3 times daily.  5. PhosLo 2 capsules p.o. b.i.d. at bedtime.  6. MiraLAX once daily.  7. Metoclopramide 10 mg p.o. every six hours as needed.  8. Lorazepam 1 mg p.o. 4 times daily. 9. Lactulose 30 p.o. b.i.d.  10. Cymbalta 60 mg p.o. at bedtime.  11. Clindamycin 300 mg p.o. every six hours.  12. Norco 5/325, 1 to 2 tablets every four hours as needed.   REVIEW OF SYSTEMS: CONSTITUTIONAL: No fever. Positive fatigue and weakness. EYES: No blurred or double vision. ENT: No tinnitus or ear pain. RESPIRATORY: No cough, wheezing, hemoptysis. CARDIOVASCULAR: Positive for minimal shortness of breath. No chest pain or edema. GI: No nausea, vomiting, or diarrhea. GU: No dysuria or hematuria. ENDOCRINE: No polyuria or nocturia. HEMATOLOGY: Positive for anemia. No easy bruising or bleeding.  SKIN: She has a single lesion on her left buttock, has wound VAC in place. MUSCULOSKELETAL: No arthritis or muscle cramps. NEUROLOGICAL:  No tingling, numbness, or weakness. PSYCHIATRY: No history of anxiety or depression.   PHYSICAL EXAMINATION:  VITAL SIGNS: Temperature 98, heart rate 92 per minute, respirations 20 per minute, blood pressure initially was 213/108 mmHg.  She was saturating 100% on room air. Her blood pressure is improved to 170/97 mmHg.   GENERAL: The patient is a 49 year old female lying in bed comfortably without any acute distress.   EYES: Pupils equal, round, and reactive to light and accommodation. No scleral icterus. Extraocular muscles intact.  HENT: Head atraumatic, normocephalic. Oropharynx and nasopharynx clear.   NECK: Supple. No jugular venous distention. No thyroid enlargement or tenderness.   LUNGS:  Decreased breath sounds in the bases. She has crackles and rales at the bases bilaterally.   CARDIOVASCULAR: S1, S2 normal. No murmur, rubs, or gallop.   ABDOMEN: Soft, nontender, nondistended. Bowel sounds present. No organomegaly or masses.   EXTREMITIES:  No pedal edema, cyanosis or clubbing. She has a fistula in her left arm. Minimal tenderness at the site. No signs of obvious infection. No discharge.   SKIN: She has a wound VAC in place in her left buttock, no obvious signs of infection.   NEUROLOGIC: Cranial nerves III-XII intact. Muscle strength 5/5. Extremity sensation intact.   PSYCH:  The patient is oriented to time, place, and person.   LABORATORY, DIAGNOSTIC, AND RADIOLOGICAL DATA: Normal BMP except BUN of 43, creatinine 12.44. Normal liver function tests. Normal first set of cardiac enzymes. Normal CBC except hemoglobin of 6.8, hematocrit 21.1.   IMPRESSION AND PLAN:  1. Anemia, acute on chronic disease, likely due to chronic kidney disease.  We will transfuse 1 unit of packed red blood cells.  She was explained the risks and benefits of transfusion and understands well, agrees with the transfusion. Her hemoglobin is 6.8. We will monitor and recheck it tomorrow. No obvious signs of bleeding.  2. Shortness of breath likely due to volume overload from being unable to get dialysis done for almost a week. We will get vascular surgery consult to get fistula working and also consult nephrology to get her dialyzed here. 3. MRSA abscess in her left buttock with wound VAC in place, due to be changed tomorrow. We will get wound care management consult along with surgical consult.  4. Malignant hypertension with initial blood pressure of 213/108. Transfer to the Critical Care Unit. Her pressure is slowly improving. It is 172/109. Initially it was thought to start her on nitroprusside, but we will hold off and start her home medication and monitor. We will order hydralazine for better blood pressure control as needed.  5. CODE STATUS: FULL CODE.   The patient's case was discussed with Dr. Gilda CreaseSchnier and Dr. Egbert GaribaldiBird.  They will see the patient.    TOTAL TIME TAKING CARE OF THIS PATIENT (CRITICAL CARE):  55 minutes.    ____________________________ Ellamae SiaVipul  S. Sherryll BurgerShah, MD vss:bjt D: 07/24/2011 17:37:11 ET T: 07/25/2011 06:40:43 ET JOB#: [id number]  cc: Shandreka Dante S. Sherryll BurgerShah, MD, <Dictator> Serita ShellerErnest B. Maryellen PileEason, MD Patricia PesaVIPUL S Norita Meigs MD ELECTRONICALLY SIGNED 07/25/2011 9:57

## 2014-09-03 NOTE — Op Note (Signed)
PATIENT NAME:  Veronica Gordon, Veronica Gordon MR#:  [id number] DATE OF BIRTH:  [birth year]  DATE OF PROCEDURE:  06/24/2011  PREOPERATIVE DIAGNOSIS: Left lateral buttock  abscess.   POSTOPERATIVE DIAGNOSIS: Left lateral buttock  abscess.   PROCEDURE PERFORMED: Incision and drainage of left buttock abscess.   SURGEON: Natale LayMark Kamilah Correia, M.D.   ANESTHESIA: General endotracheal.   FINDINGS: There was a 20-cm x 20-cm area of cellulitis and induration at the center of which was an area of 9 x 9 cm of abscess formation with a cavity measuring the same. The skin opening was 2 cm x 3 cm.   SPECIMENS: Pus.   ESTIMATED BLOOD LOSS: Minimal.   DESCRIPTION OF PROCEDURE: With the patient in the supine position, general endotracheal anesthesia was induced. With the aid of a beanbag she was positioned in the left side up, lateral decubitus position. Appropriate padding was undertaken. The existing dressing was removed. The left buttock was prepped and draped with Betadine solution.   Time-out procedure was observed.   At the center of this area as described above was an area of necrosis of the skin. This was gently probed with a hemostat clamp and a large amount of pus was extruded. Micro-bacteriological culture was obtained. Finger fracture technique was used to disrupt all loculations underlying the skin. A large subcutaneous pocket of purulence was identified. The wound was irrigated copiously with saline and then packed open with a 2-inch Kling soaked in Betadine solution. A sterile occlusive dressing was placed.    The patient was then turned supine, extubated and taken to the recovery room in stable and satisfactory condition.      ____________________________ Redge GainerMark A. Egbert GaribaldiBird, MD mab:bjt D: 06/25/2011 09:00:57 ET T: 06/25/2011 10:02:17 ET JOB#: [id number]  cc: Loraine LericheMark A. Egbert GaribaldiBird, MD, <Dictator> Raynald KempMARK A Henery Betzold MD ELECTRONICALLY SIGNED 06/26/2011 14:32

## 2014-09-03 NOTE — Consult Note (Signed)
General Aspect complication of AV fistula    Present Illness Ms. Veronica Gordon is a 49 year old female with history of end-stage renal disease on hemodialysis and history of severe systemic hypertension. The patient reported that her left arm shunt is clotted and she did not have adequate dialysis. Last dialysis was on Tuesday a week ago. Her symptoms worsened in terms of nausea, anorexia, and today she had four episodes of vomiting. The patient stated that she called her nephrologist, who is now in [place], and he asked her to go to the Emergency Department. Evaluation in the ER yesterday reveals evidence of severe end-stage renal disease with elevated creatinine and evidence of hyperkalemia and also anemia of chronic disease. The patient was admitted for further evaluation and management. I am asked to evaluate her fistula.   PAST MEDICAL HISTORY:  1. History of end-stage renal disease, on hemodialysis.  2. History of severe systemic hypertension.  3. History of obesity. 4. History of MRSA and bedsore on the left buttock area that is healing well.  5. History of depression and anxiety.   Home Medications: Medication Instructions Status  rOPINIRole 1 mg tablet 1 tab(s) orally once a day (at bedtime)  Active  traZODone 100 mg tablet 1 tab(s) orally once a day (at bedtime)  Active  metoclopramide 10 mg tablet 1 tab(s) orally every 6 hours as needed   Active  LORazepam 1 mg tablet 1 tab(s) orally 4 times a day Active  Wellbutrin XL 300 mg/24 hours oral tablet, extended release 1 tab(s) orally once a day (at bedtime) Active  Cymbalta 60 mg oral delayed release capsule 1 cap(s) orally once a day (at bedtime) Active  acetaminophen-oxycodone 325 mg-5 mg oral tablet 1-2 tab(s) orally every 4 hours, As Needed- for Pain  Active  MiraLax oral powder for reconstitution 17 gram(s) orally once a day Active  lactulose 10 g/15 mL oral syrup 30 milliliter(s) orally 2 times a day, As Needed- for Constipation   Active  PhosLo Gelcap 667 mg oral capsule 3  orally 3 times a day with meals Active  PhosLo Gelcap 667 mg oral capsule 2 cap(s) orally 2 times a day at hs with snack. Active  clonidine 0.1 mg oral tablet 1 tab(s) orally 2 times a day Active  lisinopril 10 mg oral tablet 1 tab(s) orally once a day Active    Midazolam: Agitation, Anxiety  Case History:   Family History Non-Contributory    Social History positive  tobacco, negative ETOH, negative Illicit drugs   Review of Systems:   ROS Pt not able to provide ROS   Physical Exam:   GEN WD, WN    NECK No masses  trachea midline    RESP normal resp effort  no use of accessory muscles    CARD regular rate  LE edema present    VASCULAR ACCESS AV fistula present  Good bruit  Good thrill  currently patient o HD and access without difficulty    ABD nondistended, nontender    EXTR negative cyanosis/clubbing, positive edema    PSYCH lethargic   Nursing/Ancillary Notes: **Vital Signs.:   26-Mar-13 10:09   Temperature Temperature (F) 98.6   Celsius 37   Temperature Source oral   Pulse Pulse 84   Respirations Respirations 20   Systolic BP Systolic BP 357   Diastolic BP (mmHg) Diastolic BP (mmHg) 86   Mean BP 108   Pulse Ox % Pulse Ox % 96   Pulse Ox Activity Level  At rest   Oxygen Delivery Room Air/ 21 %   Blood Glucose:  26-Mar-13 00:07    POCT Blood Glucose 30    00:48    POCT Blood Glucose 121    03:03    POCT Blood Glucose 86  Routine Chem:  26-Mar-13 05:28    Glucose, Serum 112   BUN 74   Creatinine (comp) 19.76   Sodium, Serum 138   Potassium, Serum 6.8   Chloride, Serum 98   CO2, Serum 27   Calcium (Total), Serum 8.8   Anion Gap 13   Osmolality (calc) 298   eGFR (African American) 2   eGFR (Non-African American) 2  Blood Glucose:  26-Mar-13 05:57    POCT Blood Glucose 125    09:21    POCT Blood Glucose 50    09:28    POCT Blood Glucose 44  Routine Chem:  26-Mar-13 11:59    Potassium, Serum 5.3     14:01    Phosphorus, Serum 7.9     Impression 1.  Complication of dialysis access/device          currently the access was cannulated successfully.  If further problems arise she should have a fistulagram to evaluate the access for abnormalities. 2.  End stage renal disease          continue dialysis therapy per nephrology 3.  Hypertension           continue home meds 4.  Tobacco dependence            cessation information given    Plan level 3   Electronic Signatures: Hortencia Pilar (MD)  (Signed 26-Mar-13 21:21)  Authored: General Aspect/Present Illness, Home Medications, Allergies, History and Physical Exam, Vital Signs, Labs, Impression/Plan   Last Updated: 26-Mar-13 21:21 by Hortencia Pilar (MD)

## 2014-09-03 NOTE — Consult Note (Signed)
PATIENT NAME:  Veronica Gordon, Veronica Gordon MR#:  [id number] DATE OF BIRTH:  August 15, 1965  DATE OF CONSULTATION:  06/26/2011  REFERRING PHYSICIAN:  Dr. Egbert GaribaldiBird  CONSULTING PHYSICIAN:  Rosalyn GessMichael Gordon. Khalil Szczepanik, MD  REASON FOR CONSULTATION: Antibiotic management for MRSA abscess.   HISTORY OF PRESENT ILLNESS: Patient is a 49 year old black female with a past history significant for end-stage renal disease on home hemodialysis who was admitted two days ago with an increasing left buttock mass. The patient had been in her usual state of health until approximately a week or so before when she noticed a hair follicle that was slightly infected on the left buttock. She continued to observe it for several days and it began getting significantly bigger and more uncomfortable. She began soaking it and had some drainage but the redness and swelling and pain continued to worsen. She eventually presented to the hospital and was admitted to the surgical service. She underwent incision and drainage of the lesion on 02/13 and a large amount of purulent material was drained. Her cultures have grown MRSA. She has been on vancomycin since admission. She states that at home she was having fevers and chills as well as some nausea and vomiting. She had generalized malaise as well. Her white count on admission was 18.6 and has come down today to 11.4.   ALLERGIES: Midazolam.   PAST MEDICAL HISTORY:  1. End-stage renal disease on home hemodialysis.  2. Hypertension.  3. Obesity.   SOCIAL HISTORY: Patient lives at home. She does not smoke nor does she drink.  FAMILY HISTORY: Noncontributory.    REVIEW OF SYSTEMS: GENERAL: Positive fevers, chills, and malaise. RESPIRATORY: No cough. No shortness of breath. CARDIAC: No chest pains or palpitations. GASTROINTESTINAL: Positive nausea, vomiting. No change in her bowels. GENITOURINARY: No change in her urine. MUSCULOSKELETAL: No complaints. SKIN: She had the single lesion on the left buttock. She  states that in the past she has had a somewhat similar episode although not as significant around the time that she started dialysis. She had another episode related to shaving pubic hair; neither of these episodes was she told that she had MRSA, however. The most recent of these occurred in November 2011. She has not had any other skin lesions since then. NEUROLOGIC: No focal weakness. She is more lethargic than usual.   PHYSICAL EXAMINATION:  VITAL SIGNS: T-max 99.3, T-current 97.5, pulse 105, blood pressure 113/78, 98% on room air.   GENERAL: 49 year old obese black female in no acute distress.   HEENT: Normocephalic, atraumatic.   CHEST: Clear to auscultation bilaterally with good air movement. No focal consolidation.   CARDIAC: Regular rate and rhythm without murmur, rub, or gallop. Distant S1 and S2.   ABDOMEN: Obese, soft, nontender, nondistended. No hepatosplenomegaly. No hernia is noted.   EXTREMITIES: No evidence for tenosynovitis.   SKIN: There was a bandage covering the left buttock that was saturated with yellowish drainage. The surrounding skin was not particularly erythematous. The area was mildly tender.   NEUROLOGIC: The patient was lethargic at times but arousable, unable to provide a reasonable history.   PSYCHIATRIC: Mood and affect appeared normal.   LABORATORY, DIAGNOSTIC, AND RADIOLOGICAL DATA: BUN 54, creatinine 10.94, white count 11.4, hemoglobin 8.2, platelet count 423, ANC 7.6. White count on admission was 18.6 with an ANC of 14.8. Blood cultures from admission show no growth. Wound culture from admission is growing MRSA. An intraoperative wound culture is growing MRSA as well.   IMPRESSION: 49 year old black female  with a history of end-stage renal disease on home hemodialysis was admitted with MRSA left buttock abscess.   RECOMMENDATIONS:  1. She has undergone incision and drainage. The wound is still draining enough to soak the bandages. She is currently  afebrile.  2. Her white count has come down. Will recheck again in the morning.  3. Will change her to oral doxycycline. This would not require a renal dose adjustment.  4. Would treat her for 10 days from the incision and drainage (through 07/04/2011).  5. Would continue contact isolation.   This is a low level infectious disease consult. Thank you very much for involving me in Ms. Ellin Mayhew' care.   ____________________________ Rosalyn Gess. Adalai Perl, MD meb:cms D: 06/26/2011 11:40:48 ET T: 06/26/2011 13:24:39 ET JOB#: [id number]  cc: Rosalyn Gess. Anneka Studer, MD, <Dictator> Lille Karim Gordon Zanyiah Posten MD ELECTRONICALLY SIGNED 06/26/2011 13:44

## 2014-09-03 NOTE — Op Note (Signed)
PATIENT NAME:  Veronica Gordon, Luane E MR#:  [id number] DATE OF BIRTH:  07-09-1965  DATE OF PROCEDURE:  06/29/2011  PREOPERATIVE DIAGNOSIS: Buttock abscess.   POSTOPERATIVE DIAGNOSIS: Buttock abscess.   PROCEDURE PERFORMED: Debridement with wound VAC placement.   SURGEON: Quentin Orealph L. Ely III, MD  ANESTHESIA: General.   DESCRIPTION OF PROCEDURE: With the patient in the supine position after induction of appropriate general anesthesia, the patient was placed in lateral position, appropriately padded and positioned. The previous dressing was removed and the patient's buttock wound was prepped with ChloraPrep and draped with sterile towels. Sharp debridement was accomplished using a knife through soft tissue, muscle and skin. Bleeding areas were controlled with pressure with Bovie electrocautery. A wound VAC was placed without difficulty. Estimated blood loss was 100 mL. The patient was awakened and returned recovery room in satisfactory condition. Sponge, instrument, and needle counts were correct x2 in the Operating Room.   ____________________________ Quentin Orealph L. Ely III, MD rle:cms D: 07/23/2011 15:56:40 ET T: 07/23/2011 16:25:59 ET JOB#: [id number]  cc: Quentin Orealph L. Ely III, MD, <Dictator> Quentin OreALPH L ELY MD ELECTRONICALLY SIGNED 07/25/2011 7:26

## 2014-09-03 NOTE — Op Note (Signed)
PATIENT NAME:  Veronica Gordon, Veronica Gordon MR#:  [id number] DATE OF BIRTH:  [birth year]  DATE OF PROCEDURE:  07/04/2011  OPERATION PERFORMED: Wound VAC change.   PREOPERATIVE DIAGNOSIS: Left buttock wound.   POSTOPERATIVE DIAGNOSIS:  Left buttock wound.  SURGEON: Claude MangesWilliam F Nickisha Hum, M.D.   ANESTHESIA: 4 mg IV morphine.   PROCEDURE IN DETAIL: The patient lay prone in the bed and her old wound VAC was removed. About 65% of the wound showed healthy granulation tissue and 35% showed some black eschar. There was no significant surrounding exudate and there was no significant pus. There was still some surrounding erythema or cellulitis, however. A new sponge was cut to the appropriate size, placed in the wound, and the KCI appliance was attached after adding benzoin to the skin. It was hooked back to the wound VAC and had an excellent seal.    ____________________________ Claude MangesWilliam F. Dallie Patton, MD wfm:bjt D: 07/04/2011 13:43:23 ET T: 07/04/2011 13:50:53 ET JOB#: [id number]  cc: Claude MangesWilliam F. Sequoia Mincey, MD, <Dictator> Claude MangesWILLIAM F Eren Ryser MD ELECTRONICALLY SIGNED 07/05/2011 8:52

## 2014-09-03 NOTE — Op Note (Signed)
PATIENT NAME:  Veronica Gordon, Veronica Gordon MR#:  [id number] DATE OF BIRTH:  10-26-1965  DATE OF PROCEDURE:  06/28/2011  PREOPERATIVE DIAGNOSIS:  Abscess left buttock cheek.  POSTOPERATIVE DIAGNOSIS:  Abscess left buttock cheek.  PROCEDURE:  Wound debridement and Wound VAC placement.  SURGEON:  Dr. Michela PitcherEly  ANESTHESIA:  General.  OPERATIVE PROCEDURE:  With the patient in the supine position after the induction of appropriate general anesthesia, the patient was placed in the right side down, left side up position and appropriately padded and positioned.  Her perineal area around the abscess site was prepped with Betadine and draped with sterile towels.  An excisional debridement was carried out using a scalpel.  The tissue that was removed was obviously necrotic with an area of nonviable devitalized tissue included.  The base of the wound was down through the subcutaneous tissue through the fat to the fascia.  There did not appear to be any muscle exposed.  There was no bone exposed.  The wound was debrided back to fresh bleeding tissue.  Hemostasis was achieved with Gelfoam and Surgicel.  Wound VAC was then placed.  The patient was returned to the recovery room having tolerated the procedure well.  Sponge, instrument, and needle counts were correct times two in the operating room.  ____________________________ Quentin Orealph L. Ely III, MD rle:bjt D: 06/29/2011 12:37:15 ET T: 06/29/2011 14:37:10 ET JOB#: [id number]  cc: Quentin Orealph L. Ely III, MD, <Dictator> Quentin OreALPH L ELY MD ELECTRONICALLY SIGNED 06/30/2011 8:28

## 2014-09-03 NOTE — Discharge Summary (Signed)
PATIENT NAME:  Veronica Gordon, Loghan E MR#:  [id number] DATE OF BIRTH:  [birth year]  DATE OF ADMISSION:  08/04/2011 DATE OF DISCHARGE:  08/06/2011  ADDENDUM   ADDITIONAL MEDICATION:  Lidocaine jelly 2% apply before hemodialysis. The patient will be given seven day supply.   FOLLOWUP: She is to follow-up with her primary care physician, Dr. Maryellen PileEason, for further recommendations in regards to Lidocaine injections or Lidocaine jelly.   ____________________________ Katharina Caperima Aman Bonet, MD rv:ap D: 08/06/2011 20:14:17 ET T: 08/07/2011 12:21:39 ET JOB#: [id number]  cc: Katharina Caperima Leaman Abe, MD, <Dictator> Serita ShellerErnest B. Maryellen PileEason, MD Katharina CaperIMA Treysean Petruzzi MD ELECTRONICALLY SIGNED 08/13/2011 13:58

## 2014-09-03 NOTE — H&P (Signed)
PATIENT NAME:  Veronica Gordon, WINNETT MR#:  [id number] DATE OF BIRTH:  1965-10-31  DATE OF ADMISSION:  08/04/2011  PRIMARY CARE PHYSICIAN: Dr. Maryellen Pile    CHIEF COMPLAINT: Nausea and repetitive vomiting. The patient is complaining of clotted shunt in the left arm and could not have adequate dialysis.   HISTORY OF PRESENT ILLNESS: Ms. Luiz Blare is a 49 year old African American female with history of end-stage renal disease on hemodialysis and history of severe systemic hypertension. The patient reported that her left arm shunt is clotted and she did not have adequate dialysis. Last dialysis was on Tuesday a week ago. Her symptoms worsened in terms of nausea, anorexia, and today she had four episodes of vomiting. The patient stated that she called her nephrologist, who is now in [place], and he asked her to go to the Emergency Department. Evaluation here reveals evidence of severe end-stage renal disease with elevated creatinine and evidence of hyperkalemia and also anemia of chronic disease. The patient was admitted for further evaluation and management.   REVIEW OF SYSTEMS: CONSTITUTIONAL: The patient denies having any fever. No chills. No night sweats. EYES: No blurring of vision. No double vision. ENT: No hearing impairment. No sore throat. No dysphagia. She reports that there is some fullness feeling of the submandibular area. CARDIOVASCULAR: No chest pain. No shortness of breath. No syncope. RESPIRATORY: No shortness of breath. No cough. No sputum production. No chest pain. GASTROINTESTINAL: Reports nausea and repetitive vomiting. No abdominal pain. GENITOURINARY: No dysuria. No frequency of urination. No vaginal bleed. MUSCULOSKELETAL: No joint pain or swelling. No muscular pain or swelling. INTEGUMENTARY: No skin rash but she has an ulcer on the left buttock area. This is secondary to MRSA and she is using local care and is healing well. NEUROLOGY: No focal weakness. No seizure activity. She reports  occasional headache. PSYCHIATRY: She has history of depression and anxiety. Both are controlled with medications.   PAST MEDICAL HISTORY:  1. History of end-stage renal disease, on hemodialysis.  2. History of severe systemic hypertension.  3. History of obesity. 4. History of MRSA and bedsore on the left buttock area that is healing well.  5. History of depression and anxiety.   FAMILY HISTORY: Her mother suffered from severe hypertension and end-stage renal disease.    SOCIAL HISTORY: She is married, living with her husband. The patient reports that she has no LIVING WILL and she makes decisions herself.   SOCIAL HABITS: Chronic smoker since age of 24. She smokes lightly about one cigarette maybe every other day. No history of alcohol or other drug abuse.   ADMISSION MEDICATIONS:  1. Trazodone 100 mg at bedtime. 2. Requip 1 mg at bedtime.  3. Reglan 10 mg 4 times a day p.r.n.  4. Lorazepam 1 mg 4 times a day. 5. Wellbutrin XL 300 mg at bedtime. 6. Cymbalta 60 mg once a day. 7. Tylenol with oxycodone 1 to 2 tablets q.4 hours p.r.n.  8. Lactulose 30 mL twice a day as needed. 9. PhosLo 3 capsules 3 times a day.  10. Clonidine 0.1 mg twice a day.  11. Lisinopril 10 mg a day.   ALLERGIES: Midazolam.   PHYSICAL EXAMINATION:   VITAL SIGNS: Blood pressure 157/77, respiratory rate 18, pulse 94, temperature 98.1, oxygen saturation 98%.   GENERAL APPEARANCE: This is a young female laying in bed in no acute distress.   HEAD: No pallor. No icterus. No cyanosis.   EARS, NOSE, AND THROAT: Hearing was normal. Nasal mucosa,  lips, tongue were normal.   EYES: Normal eyelids and conjunctivae. Pupils about 4 mm, equal, sluggishly reactive to light. Submandibular area showed little fullness but no discrete mass or any skin changes. No lymphadenopathy.   NECK: Supple. Trachea at midline. No masses. No lymphadenopathy.   HEART: Normal S1, S2. No S3, S4. No murmur. No gallop. No carotid bruits.    LUNGS: Normal breathing pattern without use of accessory muscles. No rales. No wheezing.   ABDOMEN: Soft without tenderness. No hepatosplenomegaly. No masses. No hernias.   MUSCULOSKELETAL: No joint swelling. No clubbing.   SKIN: No subcutaneous nodules. No ulcers. On the left buttock area there is a few cm bedsore stage II. This looks very healthy without any discharge. No redness or surrounding redness. No swelling. This is much better compared to the pictures that she showed me regarding her ulcer earlier.   NEUROLOGY: Cranial nerves II through XII are intact. No focal motor deficit.    PSYCHIATRY: The patient is alert and oriented x3. Mood and affect were normal.   LABORATORY, DIAGNOSTIC, AND RADIOLOGICAL DATA: EKG showed normal sinus rhythm at rate of 94 per minute. Unremarkable EKG. Blood sugar 69, BUN 74, creatinine 19, sodium 139, potassium 6.7, estimated GFR is about 2. CBC showed white count of 5900, hemoglobin 7.9, hematocrit 24, platelet count 170. Prothrombin time 13. INR 1. APTT 36.   ASSESSMENT:  1. Symptoms of azotemia or uremia associated with nausea and repetitive vomiting.  2. Hyperkalemia.  3. End-stage renal disease, on hemodialysis.  4. Clotted left arm shunt.  5. Anemia of chronic disease. 6. Hypoglycemia.  7. Severe systemic hypertension.  8. Bedsore on the left buttock area with history of MRSA. This is healing well.  9. History of depression and anxiety.   PLAN:  1. Admit to the medical floor.  2. The patient received dextrose 50 for her hypoglycemia. I gave another dose of dextrose 50 since her blood sugar went down further while she was in the Emergency Department. The second level of blood sugar was 30, probably precipitated by the regular insulin that was given earlier about 10 units.  3. Nephrology consultation.  4. Continue the rest of her home medications as listed above. I will hold lisinopril due to her current hyperkalemia until dialysis is  initiated and this problem is corrected.  5. CODE STATUS is FULL CODE. She does not have a LIVING WILL but she makes decisions for herself.  6. For her hyperkalemia, I gave her one dose of Kayexalate 15 grams. She also received calcium, glucose, and insulin.   TIME SPENT EVALUATING THIS PATIENT: More than 55 minutes.   ____________________________ Carney CornersAmir M. Rudene Rearwish, MD amd:drc D: 08/05/2011 00:10:29 ET T: 08/05/2011 09:19:38 ET JOB#: [id number]  cc: Carney CornersAmir M. Rudene Rearwish, MD, <Dictator> Serita ShellerErnest B. Maryellen PileEason, MD Zollie ScaleAMIR M Roselia Snipe MD ELECTRONICALLY SIGNED 08/05/2011 22:52

## 2014-09-03 NOTE — H&P (Signed)
Subjective/Chief Complaint Left buttock pain and swelling    History of Present Illness 49 year old female with ESRF on homne HD traveled from [place] over weekend and has noticed a small bump on left buttock several days ago.  Are became more painful, swollen and draining.  Last dialysis this am.    Past History ESRD obesity HTN    Past Medical Health Hypertension, obesity    Primary Physician Lateef   Past Med/Surgical Hx:  Dialysis:   Renal Failure:   Depression:   hypertension:   ALLERGIES:  Midazolam: Agitation, Anxiety    Other Allergies none   HOME MEDICATIONS: Medication Instructions Status  rOPINIRole 1 mg tablet 1 tab(s) orally once a day (at bedtime)  Active  traZODone 100 mg tablet 1 tab(s) orally once a day (at bedtime)  Active  metoclopramide 10 mg tablet 1 tab(s) orally every 6 hours as needed   Active  acetaminophen-oxyCODONE 325 mg-10 mg tablet 1 tab(s) orally every 6 hours as needed   Active  PhosLo Gelcap 667 mg oral capsule 3  orally 3 times a day  Active  LORazepam 1 mg tablet 1 tab(s) orally 4 times a day Active  PhosLo Gelcap 667 mg oral capsule 2 cap(s) orally 2 times a day Active  Wellbutrin XL 300 mg/24 hours oral tablet, extended release 1 tab(s) orally once a day (at bedtime) Active  Cymbalta 60 mg oral delayed release capsule 1 cap(s) orally once a day (at bedtime) Active   Family and Social History:   Family History Non-Contributory    Social History negative tobacco, negative ETOH    Place of Living Home  on home HD   Review of Systems:   Subjective/Chief Complaint see above    Fever/Chills No    Sputum No    Abdominal Pain No    Diarrhea No    Nausea/Vomiting No   Physical Exam:   GEN NAD, obese    HEENT pale conjunctivae    NECK supple    RESP normal resp effort  clear BS    CARD regular rate    VASCULAR ACCESS AV fistula present  Good bruit  Good thrill  -- Purulent drainage    ABD denies tenderness  no  hernia  soft    LYMPH negative neck    SKIN normal to palpation, 20 cm by 20 area of induration, celullitis and skin necrosis on left side of buttocks.    NEURO cranial nerves intact    PSYCH alert     Routine Hem:  11-Feb-13 17:18    WBC (CBC) 18.6   RBC (CBC) 2.97   Hemoglobin (CBC) 8.9   Hematocrit (CBC) 27.2   Platelet Count (CBC) 316   MCV 92   MCH 29.8   MCHC 32.5   RDW 15.0   Neutrophil % 79.5   Lymphocyte % 12.6   Monocyte % 7.3   Eosinophil % 0.5   Basophil % 0.1   Neutrophil # 14.8   Lymphocyte # 2.3   Monocyte # 1.4   Eosinophil # 0.1   Basophil # 0.0  Routine Chem:  11-Feb-13 17:18    Glucose, Serum 94   BUN 45   Creatinine (comp) 9.65   Sodium, Serum 135   Potassium, Serum 4.0   Chloride, Serum 94   CO2, Serum 30   Calcium (Total), Serum 9.5  Hepatic:  11-Feb-13 17:18    Bilirubin, Total 0.3   Alkaline Phosphatase 64   SGPT (  ALT) 8   SGOT (AST) 8   Total Protein, Serum 8.2   Albumin, Serum 2.4  Routine Chem:  11-Feb-13 17:18    Osmolality (calc) 281   eGFR (African American) 6   eGFR (Non-African American) 5   Anion Gap 11     Assessment/Admission Diagnosis 49 year old female with large leftt buttock abscess  suspect MRSA ESRD Obesity    Plan Admit, IV Vanco, unasyn. ask renal to see.   Electronic Signatures: Sherri Rad (MD)  (Signed 12-Feb-13 18:26)  Authored: CHIEF COMPLAINT and HISTORY, PAST MEDICAL/SURGIAL HISTORY, ALLERGIES, Other Allergies, HOME MEDICATIONS, FAMILY AND SOCIAL HISTORY, REVIEW OF SYSTEMS, PHYSICAL EXAM, LABS, ASSESSMENT AND PLAN   Last Updated: 12-Feb-13 18:26 by Sherri Rad (MD)

## 2014-09-03 NOTE — Op Note (Signed)
PATIENT NAME:  Veronica Gordon, [name] MR#:  [id number] DATE OF BIRTH:  15-Jan-1966  DATE OF PROCEDURE:  07/25/2011  PREOPERATIVE DIAGNOSES:  1. Complication AV fistula, left arm.  2. End-stage renal disease requiring hemodialysis. 3. Malignant hypertension.  POSTOPERATIVE DIAGNOSES:  1. Complication AV fistula, left arm.  2. End-stage renal disease requiring hemodialysis. 3. Malignant hypertension.  PROCEDURES PERFORMED: 1. Contrast injection, left wrist AV fistula.  2. Percutaneous transluminal angioplasty arterial anastomosis, left wrist AV fistula.   PROCEDURE PERFORMED BY: Renford DillsGregory G. Marlise Fahr, MD   SEDATION: Fentanyl 100 mcg plus Benadryl 25 mcg administered IV. Continuous ECG, pulse oximetry, and cardiopulmonary monitoring was performed throughout the entire procedure by the Interventional Radiology nurse. Total sedation time is one hour.  ACCESS: 6 French sheath, retrograde direction, left arm AV fistula.  CONTRAST USED: Isovue 30 mL.  FLUORO TIME: 1.8 minutes.  INDICATIONS: Ms. Veronica MayhewGraves Gordon is a 49 year old woman maintained on hemodialysis with problems during cannulation and inability to achieve her dialysis. She is, therefore, undergoing angiography with hope for intervention. Risks and benefits were reviewed, all questions were answered, the patient agrees to proceed.  PROCEDURE: The patient is taken to Special Procedures and placed in the supine position after adequate sedation is achieved. The left arm is extended palm upward and prepped and draped in a sterile fashion. 1% lidocaine is infiltrated in the soft tissues overlying the fistula. Ultrasound is placed in a sterile sleeve. Ultrasound is utilized secondary to lack of appropriate landmarks and to avoid vascular injury. Image is recorded for the permanent record. Cephalic outflow vein is identified. It is echolucent, homogeneous, and easily compressible indicating patency. Under real-time visualization, micropuncture needle  is inserted in a retrograde direction, microwire followed by micro sheath, J-wire followed by 6 French sheath. KMP catheter is advanced over the wire and positioned at the arterial anastomosis and hand injection of contrast is used to demonstrate the anatomy. The fistula is opacified with bolus injection of contrast. 70% stenosis is noted in the arterial portion of the graft and the patient is given 3000 units of heparin. A Magic torque wire is introduced and serial inflation is used. First a 5 mm balloon and then a 6 mm balloon, subsequently a 6 mm Conquest balloon, and finally a 7 mm Conquest balloon is used to dilate this area. Following the 7 mm inflation, KMP catheter is reintroduced, which it was after the other inflations, and follow-up angiography is performed. There is now resolution of the stricture stenosis in the arterial segment. The area of the buttonholes is widely patent with a generous area for access. The outflow is then interrogated with serial injections and appears to be widely patent. Purse-string suture of Monocryl was placed and sheath is removed. There are no immediate complications.  Interpretation of the initial images demonstrate there is nice dilatation of the fistula. At the level of the cannulation site in the arterial segment there is a high-grade stricture stenosis. In the outflow there is wide patency including the central veins. Following angioplasty from 5 through 7 mm, there is resolution of the stricture in the arterial portion. Visualized portions of the radial artery are widely patent.   SUMMARY: Successful salvage of the left arm radiocephalic fistula as described above.   ____________________________ Renford DillsGregory G. Brigg Cape, MD ggs:drc D: 07/25/2011 16:27:51 ET T: 07/25/2011 17:02:28 ET JOB#: [id number]  cc: Renford DillsGregory G. Tully Burgo, MD, <Dictator> Renford DillsGREGORY G Jordayn Mink MD ELECTRONICALLY SIGNED 07/29/2011 12:22

## 2014-09-03 NOTE — Discharge Summary (Signed)
PATIENT NAME:  Veronica Gordon, Veronica Gordon MR#:  [id number] DATE OF BIRTH:  1965-05-23  DATE OF ADMISSION:  08/04/2011 DATE OF DISCHARGE:  08/06/2011  ADMITTING DIAGNOSIS: Azotemia.  DISCHARGE DIAGNOSES: 1. Azotemia with recurrent nausea and vomiting.  2. Hemodialysis access complication status post left upper extremity fistulogram status post percutaneous transluminal angioplasty of stenosis site by Dr. Wyn Quaker on 08/06/2011.  3. Hyperkalemia related to end-stage renal disease.  4. Hypertension.  5. Anemia of chronic kidney disease.  6. Secondary hyperparathyroidism.  7. Left buttock methicillin-resistant Staphylococcus aureus abscess status post incision in the past, healing.    DISCHARGE CONDITION: Stable.   DISCHARGE MEDICATIONS: Patient is to resume her outpatient medications which are: 1. Ropinirole 1 mg p.o. at bedtime.  2. Trazodone 100 mg p.o. at bedtime.  3. Reglan 10 mg every six hours as needed.  4. Lorazepam 1 mg 4 times daily.  5. Wellbutrin XL 300 mg once daily which is at bedtime.  6. Cymbalta 60 mg p.o. at bedtime.  7. Acetaminophen oxycodone 325 mg/5 mg 1 to 2 tablets every four hours as needed.  8. MiraLax 17 grams once daily.  9. Lactulose 30 mL twice daily as needed.  10. PhosLo gelcaps 667 mg capsules 3 capsules 3 times daily with meals and 2 capsules twice daily with snacks.  11. Clonidine 0.1 mg twice daily.  12. Lisinopril 10 mg p.o. daily.   HOME OXYGEN: None.   DIET: Hemodialysis diet, 2 grams salt, low fat.   PHYSICAL ACTIVITY LIMITATIONS: As tolerated.   FOLLOW UP: Follow-up appointment with Dr. Maryellen Pile in two days after discharge. Follow-up appointment with Dr. Wyn Quaker in one week after discharge. Patient is to follow up with surgery as well as wound care per her prior appointments for abscess issues.  CONSULTANTS:  1. Dr. Mady Haagensen. 2. Dr. Festus Barren.  3. Dr. Levora Dredge.  4. Care management.   PROCEDURE: Ultrasound guidance for vascular access left  radiocephalic AV fistula, left upper extremity fistulogram, percutaneous transluminal angioplasty of stenosis in the swing point of the radiocephalic anastomosis with 7 mm diameter angioplasty balloon under local with moderate conscious sedation by Dr. Wyn Quaker on 08/06/2011.   HISTORY OF PRESENT ILLNESS: Patient is a 49 year old female with end-stage renal disease who presented to the hospital with complaints of nausea as well as repetitive vomiting. Patient was complaining of clotted shunt of her left arm and was not able to get adequate dialysis. Please refer to Dr. Riley Nearing admission note on 08/04/2011. On arrival to Emergency Room patient's blood pressure was 157/75, respiration rate 18, pulse 94, temperature was 98.1, oxygen saturation was 98%. Physical examination was unremarkable.   LABORATORY, DIAGNOSTIC AND RADIOLOGICAL DATA: Lab data 08/04/2011 showed low glucose level of 30, BUN and creatinine were 74 and 19.57, potassium 6.7, otherwise unremarkable BMP. CBC: White blood cell count 5.9, hemoglobin 7.9, platelet count 170. Coagulation panel was unremarkable. EKG showed normal sinus rhythm at 94 beats per minute, normal axis, no acute ST-T changes were noted. No radiologic studies were performed.  HOSPITAL COURSE: Patient was admitted to the hospital. She was rehydrated and symptomatic therapy was provided for her nausea and recurrent vomiting. Consultation with Dr. Mady Haagensen was obtained. Patient underwent hemodialysis during her stay in the hospital time. Last hemodialysis was performed today, 08/06/2011. Because patient was having problems with access consultation with Dr. Gilda Crease was obtained. Initially patient was seen by Dr. Gilda Crease on 08/05/2011. Dr. Gilda Crease felt that patient's access was cannulated successfully and he felt  that if other problems arise they would consider fistulogram. As patient had problems again with accessing AV fistula again 08/06/2011 she underwent fistulogram by Dr.  Festus BarrenJason Dew which revealed stenosis proximal to arterial access site area was opened with 7 mm PTA. Approximately 50% to 60% stenosis proximal to arterial access site was noted according to medical records. Post procedure patient did quite well. Did not complain of any significant discomfort. Her vitals were stable. Pulse 96, respiration rate 16, blood pressure 145/70, saturation was 97% on room air at rest. Patient is being discharged home. She is to follow up with her primary care physician, Dr. Maryellen PileEason. She is to continue hemodialysis as per prior recommendations. In regards to her high potassium levels, her potassium level normalized after hemodialysis, was 4.3. Patient was noted to have high phosphorus level of 7.9 on 08/05/2011 and post dialysis 6.9. Patient was noted also to be severely anemic with hemoglobin level of 7.9. Patient's anemia was even more pronounced 7.5 on 08/06/2011. It is recommended to follow patient's hemoglobin levels as outpatient and patient of course would benefit from Procrit as outpatient. In regards to hyperparathyroidism, patient's intact PTH was checked and was found to be high at 409. Patient is to follow up with her usual nephrologist as outpatient for further recommendations.   TIME SPENT: 40 minutes.   ____________________________ Veronica Caperima Shyan Scalisi, MD rv:cms D: 08/06/2011 17:31:39 ET T: 08/07/2011 11:28:32 ET JOB#: [id number]  cc: Veronica Caperima Camilah Spillman, MD, <Dictator> Serita ShellerErnest B. Maryellen PileEason, MD Veronica CaperIMA Lashara Urey MD ELECTRONICALLY SIGNED 08/13/2011 13:58

## 2014-09-03 NOTE — Discharge Summary (Signed)
PATIENT NAME:  Veronica Gordon, Veronica Gordon MR#:  [id number] DATE OF BIRTH:  [birth year]  DATE OF ADMISSION:  07/24/2011 DATE OF DISCHARGE:  07/27/2011  For a detailed note, please take a look at the history and physical done by Dr. Delfino Lovett on admission.   DIAGNOSES AT DISCHARGE:  1. Poorly functioning dialysis access, now much improved.  2. End-stage renal disease, on hemodialysis.  3. Malignant hypertension, much improved.  4. History of MRSA abscess in the buttock status post I and D, followed by Dr. Michela Pitcher.  5. Volume overload and shortness of breath secondary to patient missing her hemodialysis.   DIET: The patient is being discharged on a low sodium renal diet.   ACTIVITY: As tolerated.   FOLLOW-UP: Follow-up with Dr. Mosetta Pigeon. The patient is going to be called by Dr. Claudia Desanctis Singh's office for a follow-up in the next 1 to 2 days.   DISCHARGE MEDICATIONS:  1. Requip 1 mg at bedtime. 2. Trazodone 100 mg daily.  3. Reglan 10 mg q.6 hours as needed. 4. Lorazepam 1 mg q.i.d.  5. Wellbutrin XL 300 mg at bedtime. 6. Cymbalta 60 mg daily.  7. Tylenol with oxycodone 1 to 2 tabs q.4 hours p.r.n.  8. MiraLAX as needed.  9. Lactulose 30 mL b.i.d. as needed. 10. PhosLo three caps t.i.d. with meals, also to take two caps with a snack.  11. Clonidine 0.1 mg b.i.d.  12. Lisinopril 10 mg daily.   PERTINENT STUDIES DONE DURING THE HOSPITAL COURSE: Chest x-ray done on admission showing pulmonary vascular congestion and interstitial edema.   HOSPITAL COURSE: This is a 49 year old female with medical problems as mentioned above who presented to the hospital secondary to shortness of breath and weakness and her dialysis fistula not working properly.  1. Poorly functioning dialysis fistula. The patient apparently is on dialysis at home. She came into the hospital because she was weak and was noted to have significant hypertension and volume overload and she was not able to dialyze herself because of  non properly functioning fistula. A Vascular Surgery consult was obtained. The patient was seen by Dr. Gilda Crease who performed a fistulogram with some TPA. The patient's fistula was then properly functioning and she was restarted back on her dialysis. Her fistula seems to be functioning well now.  2. Malignant hypertension. The patient presented to the hospital with significantly high blood pressures as high as 210 on admission systolic and diastolic over 100. After getting dialyzed, the patient's blood pressure has improved although she still continues to have some labile blood pressures. Therefore, she was placed on some clonidine and lisinopril. She was not on any antihypertensives prior to coming in but is currently being discharged on two of them with further titrations to be done by her nephrologist as an outpatient. She currently was discharged on the clonidine and lisinopril as stated.  3. Shortness of breath and volume overload. This was secondary to her not being able to do her dialysis properly. After being dialyzed, her symptoms have significantly improved and now are presently resolved and she is on room air oxygen.  4. MRSA abscess. This has recently been I and D'd by Dr. Michela Pitcher. He was consulted during this hospital course who changed her dressings. She is currently off antibiotics. This is currently healing. Her dressing changes, as mentioned, were done by Dr. Michela Pitcher. The patient is to follow-up as needed with them as an outpatient.  5. Depression. The patient was maintained on her Cymbalta. She  will resume that along with her Wellbutrin upon discharge. 6. Constipation. The patient does take some MiraLAX and lactulose as needed which she will resume upon discharge.  7. Restless leg syndrome. The patient was maintained on her Requip and she will resume that upon discharge too.   CODE STATUS: The patient is a FULL CODE.       TIME SPENT: 40 minutes.  ____________________________ Rolly PancakeVivek J.  Cherlynn KaiserSainani, MD vjs:drc D: 07/27/2011 17:11:32 ET T: 07/29/2011 11:04:13 ET JOB#: [id number]  cc: Rolly PancakeVivek J. Cherlynn KaiserSainani, MD, <Dictator> Mosetta PigeonHarmeet Singh, MD Carmie Endalph L. Ely III, MD Houston SirenVIVEK J SAINANI MD ELECTRONICALLY SIGNED 07/30/2011 13:37

## 2014-09-03 NOTE — Discharge Summary (Signed)
PATIENT NAME:  Veronica Gordon, Shalunda E MR#:  [id number] DATE OF BIRTH:  15-Apr-1966  DATE OF ADMISSION:  06/23/2011 DATE OF DISCHARGE:  07/07/2011  FINAL DIAGNOSES:  1. Large buttock abscess, left side.  2. End-stage renal failure.  3. Obesity.   PRINCIPLE PROCEDURES:  1. Multiple incisions and drainages. 2. Placement of wound VAC.  CONSULTATIONS: Medicine consultation featuring Dr. Leavy CellaBlocker and Dr. Cherylann RatelLateef of nephrology, hemodialysis.   HOSPITAL COURSE SUMMARY: The patient was admitted to the hospital on 02/11 with a large buttock abscess. She was taken on 02/12 for incision and drainage. Dr. Leavy CellaBlocker became involved for management of MRSA. Patient was then taken back to the Operating Room for further debridement by Dr. Michela PitcherEly on 02/16 at which point a larger excision was performed with placement of wound VAC. Following this, the patient underwent intravenous antibiotics which was then switched to oral antibiotics. Pain control was main issue. She was eventually able to be stable for discharge on 07/07/2011. Plan was for continued wound VAC changes as an outpatient. Antibiotics were stopped on her discharge. Follow up in the office in the next week or so for follow up with wound VAC changes.   ____________________________ Redge GainerMark A. Egbert GaribaldiBird, MD mab:cms D: 07/22/2011 17:08:14 ET T: 07/23/2011 11:34:59 ET JOB#: [id number]  cc: Loraine LericheMark A. Egbert GaribaldiBird, MD, <Dictator> Raynald KempMARK A Sosaia Pittinger MD ELECTRONICALLY SIGNED 07/24/2011 14:04

## 2014-09-03 NOTE — Consult Note (Signed)
Brief Consult Note: Diagnosis: complication of AV fistula.   Patient was seen by consultant.   Recommend to proceed with surgery or procedure.   Orders entered.   Comments: will plan fistulagram.  Electronic Signatures: Levora DredgeSchnier, Akia Montalban (MD)  (Signed 14-Mar-13 18:52)  Authored: Brief Consult Note   Last Updated: 14-Mar-13 18:52 by Levora DredgeSchnier, Lundon Rosier (MD)

## 2014-09-03 NOTE — Op Note (Signed)
PATIENT NAME:  Veronica Gordon, Siara E MR#:  [id number] DATE OF BIRTH:  11-14-1965  DATE OF PROCEDURE:  08/06/2011  PREOPERATIVE DIAGNOSES:  1. End-stage renal disease.  2. Poorly functioning left radiocephalic AV fistula.  3. Hypertension.   POSTOPERATIVE DIAGNOSES:  1. End-stage renal disease.  2. Poorly functioning left radiocephalic AV fistula.  3. Hypertension.   PROCEDURES:  1. Ultrasound guidance for vascular access to left radiocephalic AV fistula.  2. Left upper extremity fistulogram.  3. Percutaneous transluminal angioplasty of stenosis in the swing point of the radiocephalic anastomosis with a 7 mm diameter angioplasty balloon.   SURGEON: Annice NeedyJason S. Ronna Herskowitz, MD  ANESTHESIA: Local with moderate conscious sedation.   ESTIMATED BLOOD LOSS: Minimal.   INDICATION FOR PROCEDURE: This is a 49 year old African American female with end-stage renal disease. Her left radiocephalic AV fistula was unable to be used for dialysis today and apparently they were "pulling clots out". The fistula has been intervened upon a few weeks ago. As she was in the hospital we went ahead and brought her down for a fistulogram today for further evaluation. Risks and benefits were discussed. Informed consent was obtained.   DESCRIPTION OF PROCEDURE: Patient is brought to the vascular interventional radiology suite. Left upper extremity was sterilely prepped and draped, a sterile surgical field was created. The cephalic vein was accessed under direct ultrasound guidance in a retrograde fashion just below the antecubital fossa with a micropuncture needle and permanent image was recorded. Micropuncture wire and sheath were placed and we upsized to a 6 [place] sheath. Imaging showed some degree of stenosis at a previously intervened upon area at the swing point just beyond the anastomosis. This did not appear high grade but was narrowed and may be as much in the 50% to 60% range. There was a medium-sized branch not far from  our access site in the mid to proximal forearm and the remainder of the fistula appeared patent. On ultrasound we had noted several areas of hematoma overlying the vein and it would be my suspicion that these were the areas that were pulling clots and that the needle access had been lost during her dialysis. We did cross the area of stenosis that was described above and treat this with a 7 mm diameter conventional and high-pressure angioplasty balloon. There was a waste that was taken that improved with angioplasty and there was some mild residual stenosis after angioplasty but this was not flow limiting. At this point I elected to terminate the procedure. A 4-0 Monocryl pursestring suture was placed around the sheath. The sheath was removed. Pressure was held. Sterile dressing was placed. The patient tolerated procedure well and was taken to the recovery room in stable condition.   ____________________________ Annice NeedyJason S. Jedd Schulenburg, MD jsd:cms D: 08/06/2011 16:03:28 ET T: 08/06/2011 16:54:49 ET JOB#: [id number]  cc: Annice NeedyJason S. Meleana Commerford, MD, <Dictator> Annice NeedyJASON S Yemaya Barnier MD ELECTRONICALLY SIGNED 08/07/2011 16:22

## 2015-02-15 IMAGING — CR DG CHEST 1V PORT
1 series · 1 of 1 positions shown · non-contrast
Comparison: Portable chest x-ray 10/17/2013, 07/25/2011. Two-view
chest x-ray 10/16/2013, 10/15/2013, 07/19/2013.

CLINICAL DATA: Fever.  followup pneumonia.

EXAM:
PORTABLE CHEST - 1 VIEW

[ap]
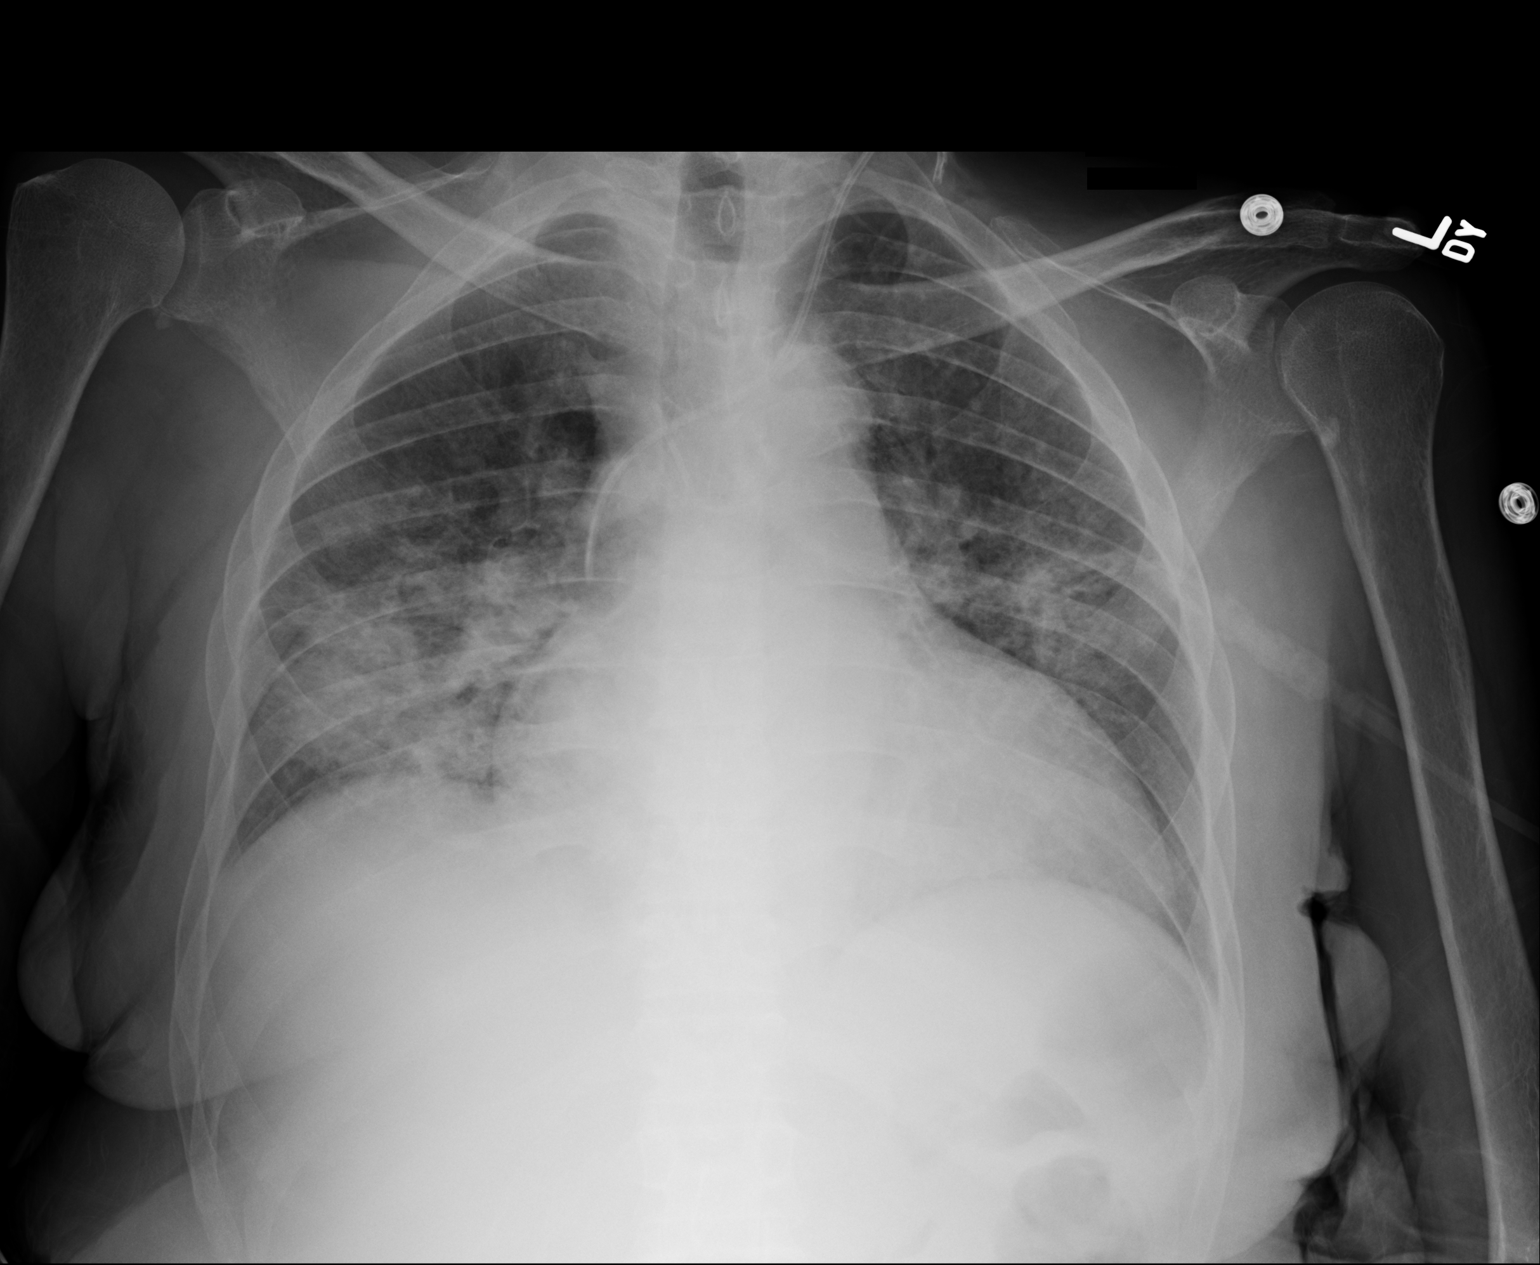

[1 of 1 positions shown; findings below may reference images not displayed]

FINDINGS: Cardiac silhouette markedly enlarged but stable. Airspace
consolidation in the right mid lung and right lung base, similar to
the examination 1 month ago. Airspace consolidation in the left mid
lung, also unchanged. Mild pulmonary venous hypertension without
overt edema. No visible pleural effusions. No new pulmonary
parenchymal abnormalities. Left jugular central venous catheter tip
projects over the mid SVC.
IMPRESSION: 1. Pneumonia involving the right mid lung a, right lung base and the
left mid lung, similar in appearance to the examination 1 month ago.
No new pulmonary parenchymal abnormality.
2. Stable marked cardiomegaly without pulmonary edema.

## 2015-02-19 IMAGING — CR DG CHEST 1V PORT
1 series · 1 of 1 positions shown · non-contrast
Comparison: Single view of the chest [DATE] is a 0 its [DATE].

CLINICAL DATA: Line placement.

EXAM:
PORTABLE CHEST - 1 VIEW

[ap]
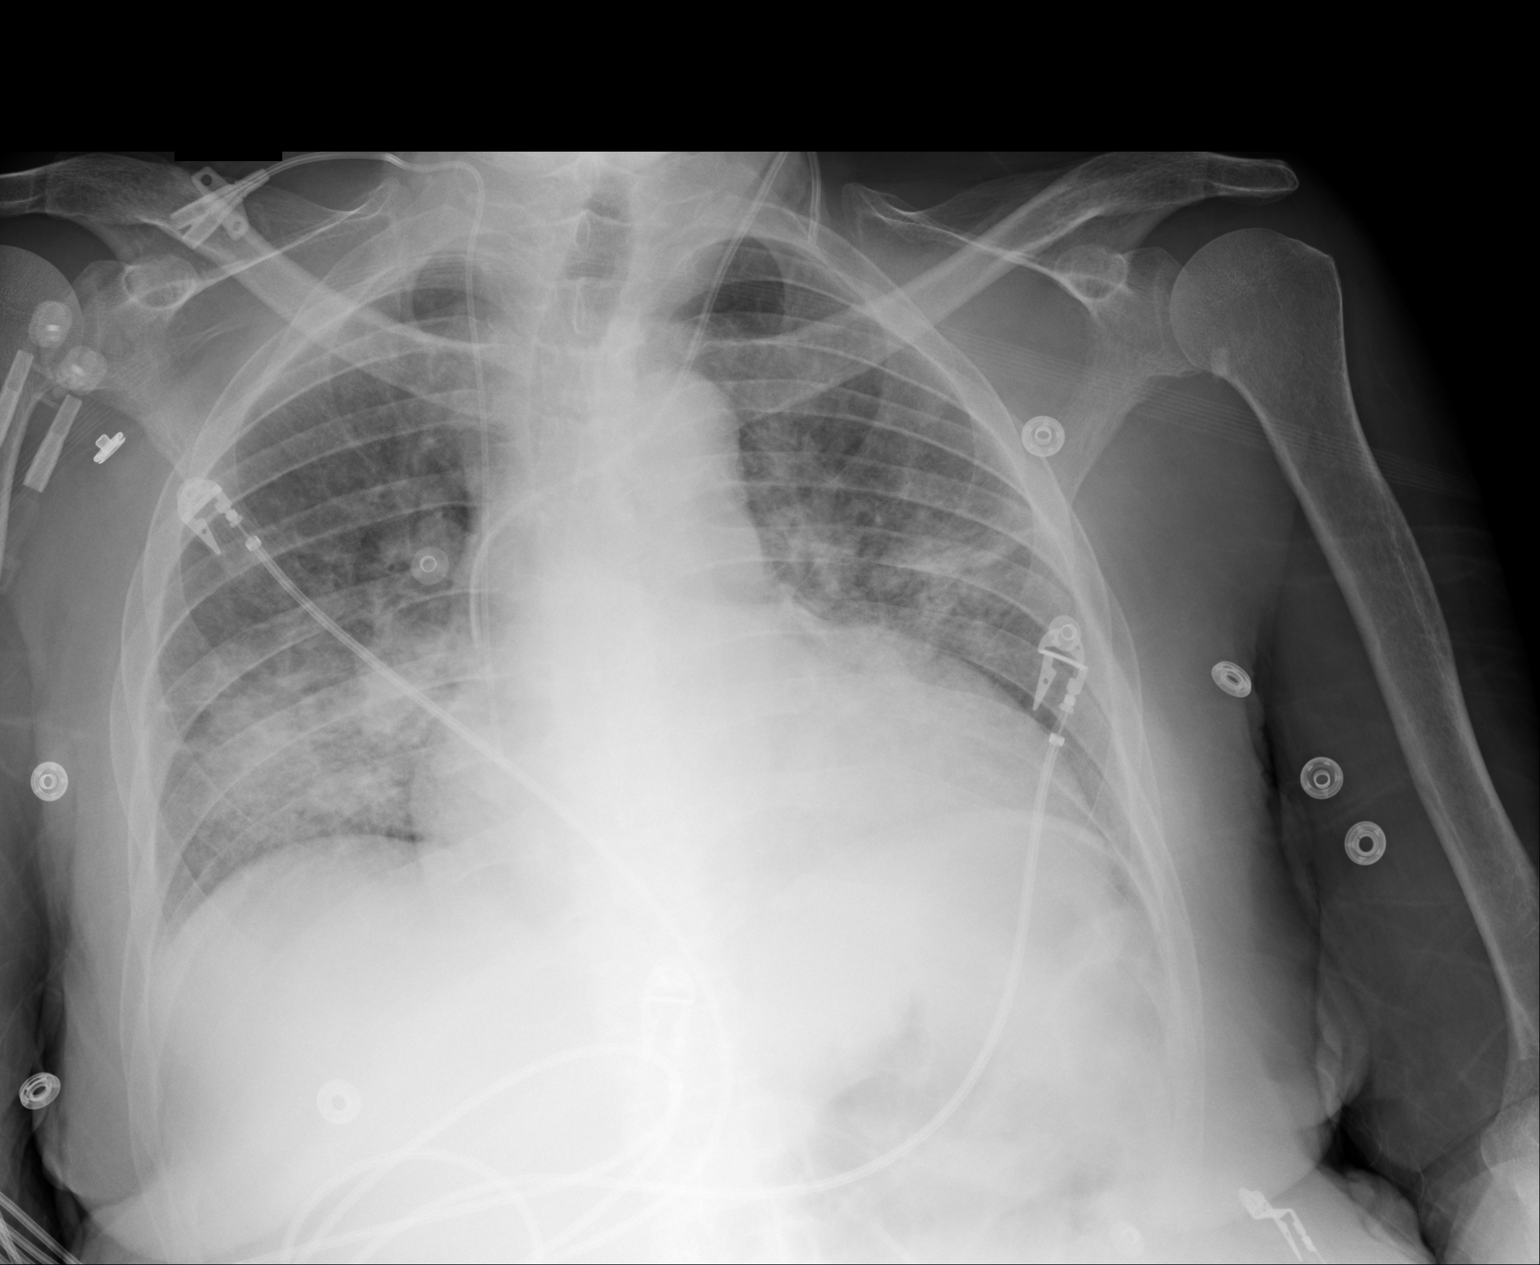

[1 of 1 positions shown; findings below may reference images not displayed]

FINDINGS: New right IJ approach catheter is in place with the tip projecting
at the superior cavoatrial junction. Left IJ catheter is unchanged.
Cardiomegaly and bilateral airspace disease persists. There is no
pneumothorax.
IMPRESSION: New right IJ catheter tip projects at the superior cavoatrial
junction. Left IJ catheter is unchanged.

No change in cardiomegaly and bilateral airspace disease.

## 2015-03-03 IMAGING — CR DG CHEST 1V PORT
1 series · 1 of 1 positions shown · non-contrast
Comparison: 11/17/2013

CLINICAL DATA: Shortness of breath.  Hypoxia.

EXAM:
PORTABLE CHEST - 1 VIEW

[ap]
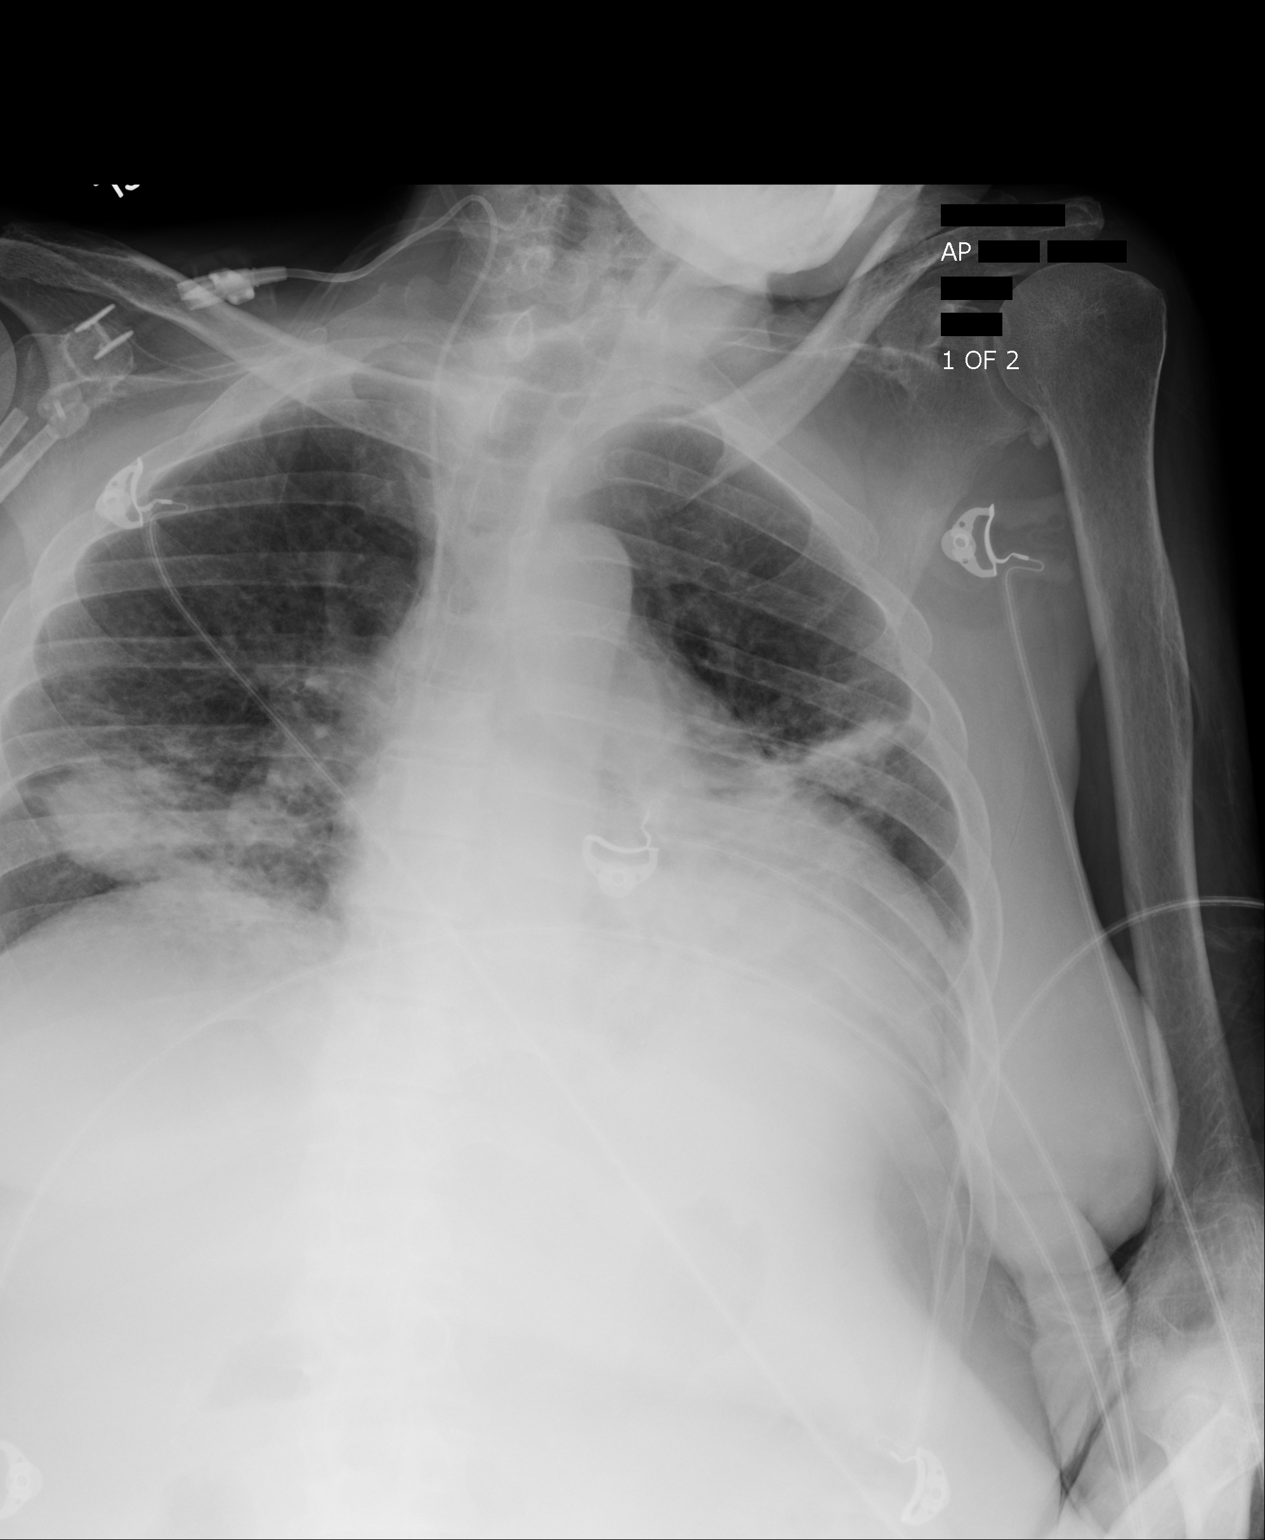

[1 of 1 positions shown; findings below may reference images not displayed]

FINDINGS: The right internal jugular PICC line remains in place however the
left internal jugular catheter has been removed since previous
study.

Low lung volumes are again seen. Bilateral pulmonary airspace
disease in the mid and lower lung zones shows no significant
interval change since previous study. Heart size is stable.
IMPRESSION: Low lung volumes with persistent bilateral mid and lower lung
airspace disease ; minimal or no significant interval change.
